# Patient Record
Sex: Female | Born: 1995 | Race: White | Marital: Married
Health system: Southern US, Community
[De-identification: ages and names within clinical notes are randomized; demographics above are authoritative.]

## PROBLEM LIST (undated history)

## (undated) ENCOUNTER — Inpatient Hospital Stay (HOSPITAL_COMMUNITY): Payer: Self-pay

## (undated) DIAGNOSIS — N329 Bladder disorder, unspecified: Secondary | ICD-10-CM

## (undated) DIAGNOSIS — N12 Tubulo-interstitial nephritis, not specified as acute or chronic: Secondary | ICD-10-CM

## (undated) DIAGNOSIS — R519 Headache, unspecified: Secondary | ICD-10-CM

## (undated) DIAGNOSIS — Z87448 Personal history of other diseases of urinary system: Secondary | ICD-10-CM

## (undated) DIAGNOSIS — Z3400 Encounter for supervision of normal first pregnancy, unspecified trimester: Secondary | ICD-10-CM

## (undated) DIAGNOSIS — Z3483 Encounter for supervision of other normal pregnancy, third trimester: Secondary | ICD-10-CM

## (undated) DIAGNOSIS — R51 Headache: Secondary | ICD-10-CM

## (undated) HISTORY — DX: Headache, unspecified: R51.9

## (undated) HISTORY — DX: Personal history of other diseases of urinary system: Z87.448

## (undated) HISTORY — DX: Headache: R51

## (undated) HISTORY — PX: WISDOM TOOTH EXTRACTION: SHX21

## (undated) HISTORY — PX: PERIPHERALLY INSERTED CENTRAL CATHETER INSERTION: SHX2221

---
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
## 2015-01-17 ENCOUNTER — Encounter (HOSPITAL_COMMUNITY): Payer: Self-pay | Admitting: *Deleted

## 2015-01-17 ENCOUNTER — Emergency Department (INDEPENDENT_AMBULATORY_CARE_PROVIDER_SITE_OTHER)
Admission: EM | Admit: 2015-01-17 | Discharge: 2015-01-17 | Disposition: A | Discharge status: Home / Self Care | Payer: Self-pay | Source: Home / Self Care | Attending: Family Medicine | Admitting: Family Medicine

## 2015-01-17 MED ORDER — IBUPROFEN 800 MG PO TABS: 800.0000 mg | ORAL_TABLET | Freq: Three times a day (TID) | ORAL | Status: DC

## 2015-01-17 MED ORDER — CYCLOBENZAPRINE HCL 5 MG PO TABS: 5.0000 mg | ORAL_TABLET | Freq: Three times a day (TID) | ORAL | Status: DC | PRN
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
## 2015-01-29 ENCOUNTER — Encounter (HOSPITAL_COMMUNITY): Payer: Self-pay | Admitting: Student

## 2015-01-29 ENCOUNTER — Inpatient Hospital Stay (HOSPITAL_COMMUNITY)
Admission: AD | Admit: 2015-01-29 | Discharge: 2015-01-29 | Disposition: A | Discharge status: Home / Self Care | Payer: Medicaid Other | Source: Ambulatory Visit | Attending: Obstetrics and Gynecology | Admitting: Obstetrics and Gynecology

## 2015-01-29 ENCOUNTER — Inpatient Hospital Stay (HOSPITAL_COMMUNITY): Payer: Medicaid Other

## 2015-01-29 ENCOUNTER — Encounter (HOSPITAL_COMMUNITY): Payer: Self-pay | Admitting: Emergency Medicine

## 2015-01-29 ENCOUNTER — Emergency Department (HOSPITAL_COMMUNITY)
Admission: EM | Admit: 2015-01-29 | Discharge: 2015-01-29 | Disposition: A | Discharge status: Home / Self Care | Payer: Medicaid Other | Source: Home / Self Care | Attending: Emergency Medicine | Admitting: Emergency Medicine

## 2015-01-29 ENCOUNTER — Other Ambulatory Visit (HOSPITAL_COMMUNITY)
Admission: RE | Admit: 2015-01-29 | Discharge: 2015-01-29 | Disposition: A | Discharge status: Home / Self Care | Payer: Medicaid Other | Source: Ambulatory Visit | Attending: Emergency Medicine | Admitting: Emergency Medicine

## 2015-01-29 DIAGNOSIS — R1032 Left lower quadrant pain: Secondary | ICD-10-CM | POA: Diagnosis not present

## 2015-01-29 DIAGNOSIS — O26899 Other specified pregnancy related conditions, unspecified trimester: Secondary | ICD-10-CM

## 2015-01-29 DIAGNOSIS — N760 Acute vaginitis: Secondary | ICD-10-CM | POA: Diagnosis not present

## 2015-01-29 DIAGNOSIS — Z3200 Encounter for pregnancy test, result unknown: Secondary | ICD-10-CM

## 2015-01-29 DIAGNOSIS — O3680X0 Pregnancy with inconclusive fetal viability, not applicable or unspecified: Secondary | ICD-10-CM

## 2015-01-29 DIAGNOSIS — Z113 Encounter for screening for infections with a predominantly sexual mode of transmission: Secondary | ICD-10-CM

## 2015-01-29 DIAGNOSIS — O2691 Pregnancy related conditions, unspecified, first trimester: Secondary | ICD-10-CM | POA: Diagnosis not present

## 2015-01-29 DIAGNOSIS — R109 Unspecified abdominal pain: Secondary | ICD-10-CM

## 2015-01-29 LAB — POCT URINALYSIS DIP (DEVICE)
BILIRUBIN URINE: NEGATIVE
Glucose, UA: NEGATIVE mg/dL
Hgb urine dipstick: NEGATIVE
KETONES UR: NEGATIVE mg/dL
Leukocytes, UA: NEGATIVE
Nitrite: NEGATIVE
PROTEIN: NEGATIVE mg/dL
Specific Gravity, Urine: 1.025 (ref 1.005–1.030)
Urobilinogen, UA: 0.2 mg/dL (ref 0.0–1.0)
pH: 5.5 (ref 5.0–8.0)

## 2015-01-29 LAB — WET PREP, GENITAL
Clue Cells Wet Prep HPF POC: NONE SEEN
Trich, Wet Prep: NONE SEEN
Yeast Wet Prep HPF POC: NONE SEEN

## 2015-01-29 LAB — CBC
HCT: 38.5 % (ref 36.0–46.0)
Hemoglobin: 13.4 g/dL (ref 12.0–15.0)
MCH: 29.8 pg (ref 26.0–34.0)
MCHC: 34.8 g/dL (ref 30.0–36.0)
MCV: 85.7 fL (ref 78.0–100.0)
PLATELETS: 317 10*3/uL (ref 150–400)
RBC: 4.49 MIL/uL (ref 3.87–5.11)
RDW: 13 % (ref 11.5–15.5)
WBC: 11.5 10*3/uL — ABNORMAL HIGH (ref 4.0–10.5)

## 2015-01-29 LAB — POCT PREGNANCY, URINE: Preg Test, Ur: NEGATIVE

## 2015-01-29 LAB — HCG, QUANTITATIVE, PREGNANCY: hCG, Beta Chain, Quant, S: 58 m[IU]/mL — ABNORMAL HIGH (ref <5)

## 2015-01-29 LAB — ABO/RH: ABO/RH(D): A POS

## 2015-01-29 MED ORDER — ACETAMINOPHEN-CODEINE #3 300-30 MG PO TABS: 1.0000 | ORAL_TABLET | Freq: Four times a day (QID) | ORAL | Status: DC | PRN

## 2015-01-29 NOTE — Discharge Instructions (Signed)
Ectopic Pregnancy An ectopic pregnancy happens when a fertilized egg grows outside the uterus. A pregnancy cannot live outside of the uterus. This problem often happens in the fallopian tube. It is often caused by damage to the fallopian tube. If this problem is found early, you may be treated with medicine. If your tube tears or bursts open (ruptures), you will bleed inside. This is an emergency. You will need surgery. Get help right away.  SYMPTOMS You may have normal pregnancy symptoms at first. These include:  Missing your period.  Feeling sick to your stomach (nauseous).  Being tired.  Having tender breasts. Then, you may start to have symptoms that are not normal. These include:  Pain with sex (intercourse).  Bleeding from the vagina. This includes light bleeding (spotting).  Belly (abdomen) or lower belly cramping or pain. This may be felt on one side.  A fast heartbeat (pulse).  Passing out (fainting) after going poop (bowel movement). If your tube tears, you may have symptoms such as:  Really bad pain in the belly or lower belly. This happens suddenly.  Dizziness.  Passing out.  Shoulder pain. GET HELP RIGHT AWAY IF:  You have any of these symptoms. This is an emergency. MAKE SURE YOU:  Understand these instructions.  Will watch your condition.  Will get help right away if you are not doing well or get worse.   This information is not intended to replace advice given to you by your health care provider. Make sure you discuss any questions you have with your health care provider.   Document Released: 06/25/2008 Document Revised: 04/03/2013 Document Reviewed: 11/08/2012 Elsevier Interactive Patient Education 2016 [employer]    Threatened Miscarriage A threatened miscarriage is when you have vaginal bleeding during your first 20 weeks of pregnancy but the pregnancy has not ended. Your doctor will do tests to make sure you are still pregnant. The cause of  the bleeding may not be known. This condition does not mean your pregnancy will end. It does increase the risk of it ending (complete miscarriage). HOME CARE   Make sure you keep all your doctor visits for prenatal care.  Get plenty of rest.  Do not have sex or use tampons if you have vaginal bleeding.  Do not douche.  Do not smoke or use drugs.  Do not drink alcohol.  Avoid caffeine. GET HELP IF:  You have light bleeding from your vagina.  You have belly pain or cramping.  You have a fever. GET HELP RIGHT AWAY IF:   You have heavy bleeding from your vagina.  You have clots of blood coming from your vagina.  You have bad pain or cramps in your low back or belly.  You have fever, chills, and bad belly pain. MAKE SURE YOU:   Understand these instructions.  Will watch your condition.  Will get help right away if you are not doing well or get worse.   This information is not intended to replace advice given to you by your health care provider. Make sure you discuss any questions you have with your health care provider.   Document Released: 03/11/2008 Document Revised: 04/03/2013 Document Reviewed: 01/23/2013 Elsevier Interactive Patient Education [employer] Elsevier Inc.

## 2015-01-29 NOTE — MAU Note (Signed)
Pt seen @ Urgent Care for LLQ pain & cramping, also lower back pain, hot flashes.  Pos HPT x 4.  UPT @ Urgent Care was negative, sent to MAU for further eval.

## 2015-01-29 NOTE — Discharge Instructions (Signed)
Please go directly to [facility] for evaluation of possible ectopic pregnancy.

## 2015-01-29 NOTE — MAU Note (Signed)
Left lower abd. Cramping x2-3 days, lower back pain, hot flashes.  4 home pregnancy tests positive.  To urgent care and pregnancy test negative.

## 2015-01-29 NOTE — ED Provider Notes (Signed)
CSN: [id number]     Arrival date & time 01/29/15  1300 History   First MD Initiated Contact with Patient 01/29/15 1314     Chief Complaint  Patient presents with  . Abdominal Cramping   (Consider location/radiation/quality/duration/timing/severity/associated sxs/prior Treatment) HPI  Leslie is a 19 year old woman here for evaluation of pelvic cramping. She states for the last 2-3 days she has had left pelvic cramping. She states it is fairly constant. It has not been worsening or improving. It seems to be a Ferraris worse with walking, particularly stepping down. No associated vaginal discharge. No new sexual partners. She and her husband are actively trying to get pregnant at this time. Last period was September 18. She reports a Kreider bit of urinary frequency, but denies urgency or dysuria. She denies other abdominal pain. She reports a Duddy bit of nausea in the morning, but states this resolves with eating breakfast. No fevers or chills. No diarrhea or constipation. She also has some low back pain. She states this is a chronic issue for her, but it has been a Wildasin more frequent in the last few weeks.    Past Medical History  Diagnosis Date  . Pyelonephritis     required hospitalization  . Bladder disorder     "small bladder" [facility]  . Pyelonephritis   . Normal pregnancy, first 02/20/2013  . HA (headache)    Past Surgical History  Procedure Laterality Date  . Peripherally inserted central catheter insertion      for home meds. due to pyelo   Family History  Problem Relation Age of Onset  . Diabetes Maternal Grandfather   . Breast cancer Maternal Grandmother    Social History  Substance Use Topics  . Smoking status: Former Smoker    Types: Cigarettes  . Smokeless tobacco: Never Used  . Alcohol Use: No   OB History    Gravida Para Term Preterm AB TAB SAB Ectopic Multiple Living   1 1 1       1      Review of Systems As in history of present illness Allergies   Review of patient's allergies indicates no known allergies.  Home Medications   Prior to Admission medications   Medication Sig Start Date End Date Taking? Authorizing Provider  albuterol (PROVENTIL HFA;VENTOLIN HFA) 108 (90 BASE) MCG/ACT inhaler Inhale 2 puffs into the lungs every 4 (four) hours as needed for wheezing or shortness of breath. 04/15/14   Hayden Rasmussenavid Mabe, NP  cyclobenzaprine (FLEXERIL) 5 MG tablet Take 1 tablet (5 mg total) by mouth 3 (three) times daily as needed for muscle spasms. 01/17/15   Linna HoffJames D Kindl, MD  ibuprofen (ADVIL,MOTRIN) 800 MG tablet Take 1 tablet (800 mg total) by mouth 3 (three) times daily. 01/17/15   Linna HoffJames D Kindl, MD  magnesium oxide (MAG-OX) 400 MG tablet Take 400 mg by mouth 2 (two) times daily.    Historical Provider, MD  nortriptyline (PAMELOR) 25 MG capsule Take 1 capsule (25 mg total) by mouth at bedtime. 02/25/14   Ronal FearLynn E Lam, NP  oseltamivir (TAMIFLU) 75 MG capsule Take 1 capsule (75 mg total) by mouth every 12 (twelve) hours. 04/12/14   Reuben Likesavid C Keller, MD  PARAGARD INTRAUTERINE COPPER IUD IUD 1 each by Intrauterine route once.    Historical Provider, MD  rizatriptan (MAXALT-MLT) 10 MG disintegrating tablet Take 1 tablet (10 mg total) by mouth as needed for migraine. May repeat in 2 hours if needed 11/15/13   Yijun  Terrace Arabia, MD  traMADol (ULTRAM) 50 MG tablet Take 2 tablets (100 mg total) by mouth every 8 (eight) hours as needed. 04/12/14   Reuben Likes, MD  vitamin B-12 (CYANOCOBALAMIN) 100 MCG tablet Take 100 mcg by mouth 2 (two) times daily.     Historical Provider, MD   Meds Ordered and Administered this Visit  Medications - No data to display  BP 122/71 mmHg  Pulse 99  Temp(Src) 98.5 F (36.9 C) (Oral)  Resp 16  SpO2 98%  LMP 12/29/2014 No data found.   Physical Exam  Constitutional: She is oriented to person, place, and time. She appears well-developed and well-nourished. No distress.  Neck: Neck supple.  Cardiovascular: Normal rate, regular rhythm  and normal heart sounds.   No murmur heard. Pulmonary/Chest: Effort normal and breath sounds normal. No respiratory distress. She has no wheezes. She has no rales.  Abdominal: Soft. Bowel sounds are normal. She exhibits no distension and no mass. There is tenderness (minimal tenderness in left lower abdomen). There is no rebound and no guarding.  Genitourinary: Vagina normal. There is no rash on the right labia. There is no rash on the left labia. Uterus is enlarged. Cervix exhibits no motion tenderness, no discharge and no friability. Right adnexum displays no mass and no tenderness. Left adnexum displays tenderness (mild) and fullness. No foreign body around the vagina. No vaginal discharge found.  Neurological: She is alert and oriented to person, place, and time.    ED Course  Procedures (including critical care time)  Labs Review Labs Reviewed  POCT URINALYSIS DIP (DEVICE)  POCT PREGNANCY, URINE  CERVICOVAGINAL ANCILLARY ONLY    Imaging Review No results found.    MDM   1. LLQ abdominal pain   2. Possible pregnancy    After doing the physical exam, patient reports she has had 4 positive home pregnancy test this morning. Even though our test here is negative, discussed with patient that we need to rule out possible ectopic pregnancy. We will discharge her and she will go directly to MAU for additional evaluation.  I have called and spoken with the triage nurse at the MAU.    Charm Rings, MD 01/29/15 1344

## 2015-01-29 NOTE — ED Notes (Signed)
Abdominal cramping, back pain.  Onset 01/27/15.  Reports hot flashes.  Last bm yesterday and normal per patient.  Reports urinating more frequently.  Most cramping on left.  And c/o hot flashes

## 2015-01-29 NOTE — MAU Provider Note (Signed)
History     CSN: [id number]  Arrival date and time: 01/29/15 1413   First Provider Initiated Contact with Patient 01/29/15 1626         Chief Complaint  Patient presents with  . Abdominal Pain   HPI  Leslie Gill is a 19 y.o. G2P1001 at 5078w3d who presents for abdominal pain.  LLQ pain x 3 days. Rates as 7/10. Describes as sharp & constant. No treatment.  Denies vaginal bleeding or discharge.  Denies n/v/d/constipation.  Denies dysuria or fever.   Sent from urgent care for evaluation. Negative UPT at urgent care, but patient had multiple positive UPTs at home.     OB History    Gravida Para Term Preterm AB TAB SAB Ectopic Multiple Living   2 1 1       1       Past Medical History  Diagnosis Date  . Pyelonephritis     required hospitalization  . Bladder disorder     "small bladder" [facility]  . Pyelonephritis   . Normal pregnancy, first 02/20/2013  . HA (headache)     Past Surgical History  Procedure Laterality Date  . Peripherally inserted central catheter insertion      for home meds. due to pyelo    Family History  Problem Relation Age of Onset  . Diabetes Maternal Grandfather   . Breast cancer Maternal Grandmother     Social History  Substance Use Topics  . Smoking status: Never Smoker   . Smokeless tobacco: Never Used  . Alcohol Use: No    Allergies: No Known Allergies  Prescriptions prior to admission  Medication Sig Dispense Refill Last Dose  . Acetaminophen-Caff-Pyrilamine (MIDOL COMPLETE PO) Take 2 capsules by mouth daily as needed (cramping).   Past Week at Unknown time  . ibuprofen (ADVIL,MOTRIN) 200 MG tablet Take 400-600 mg by mouth every 6 (six) hours as needed for headache (migraines).   Past Week at Unknown time  . albuterol (PROVENTIL HFA;VENTOLIN HFA) 108 (90 BASE) MCG/ACT inhaler Inhale 2 puffs into the lungs every 4 (four) hours as needed for wheezing or shortness of breath. (Patient not taking: Reported on 01/29/2015) 1  Inhaler 0 Not Taking at Unknown time  . cyclobenzaprine (FLEXERIL) 5 MG tablet Take 1 tablet (5 mg total) by mouth 3 (three) times daily as needed for muscle spasms. (Patient not taking: Reported on 01/29/2015) 30 tablet 0 Not Taking at Unknown time  . ibuprofen (ADVIL,MOTRIN) 800 MG tablet Take 1 tablet (800 mg total) by mouth 3 (three) times daily. (Patient not taking: Reported on 01/29/2015) 30 tablet 0 Not Taking at Unknown time  . nortriptyline (PAMELOR) 25 MG capsule Take 1 capsule (25 mg total) by mouth at bedtime. (Patient not taking: Reported on 01/29/2015) 30 capsule 5 Not Taking at Unknown time  . oseltamivir (TAMIFLU) 75 MG capsule Take 1 capsule (75 mg total) by mouth every 12 (twelve) hours. (Patient not taking: Reported on 01/29/2015) 10 capsule 0 Completed Course at Unknown time  . rizatriptan (MAXALT-MLT) 10 MG disintegrating tablet Take 1 tablet (10 mg total) by mouth as needed for migraine. May repeat in 2 hours if needed (Patient not taking: Reported on 01/29/2015) 15 tablet 11 Not Taking at Unknown time  . traMADol (ULTRAM) 50 MG tablet Take 2 tablets (100 mg total) by mouth every 8 (eight) hours as needed. (Patient not taking: Reported on 01/29/2015) 30 tablet 0 Not Taking at Unknown time    Review of  Systems  Constitutional: Negative.   Respiratory: Negative.   Cardiovascular: Negative.   Gastrointestinal: Positive for abdominal pain. Negative for nausea, vomiting, diarrhea and constipation.  Genitourinary: Negative.   Neurological: Negative for dizziness.   Physical Exam   Blood pressure 124/74, pulse 94, temperature 98.4 F (36.9 C), temperature source Oral, resp. rate 18, height  (1.6 m), weight 194 lb (87.998 kg), last menstrual period 12/29/2014, not currently breastfeeding.  Physical Exam  Nursing note and vitals reviewed. Constitutional: She is oriented to person, place, and time. She appears well-developed and well-nourished. No distress.  HENT:  Head:  Normocephalic and atraumatic.  Eyes: Conjunctivae are normal. Right eye exhibits no discharge. Left eye exhibits no discharge. No scleral icterus.  Neck: Normal range of motion.  Cardiovascular: Normal rate, regular rhythm and normal heart sounds.   No murmur heard. Respiratory: Effort normal and breath sounds normal. No respiratory distress. She has no wheezes.  GI: Soft. Bowel sounds are normal. She exhibits no distension. There is tenderness (mild tenderness in LLQ with deep palpation).  Genitourinary:  Pelvic exam deferred; exam performed & documented by MD at urgent care this afternoon.   Neurological: She is alert and oriented to person, place, and time.  Skin: Skin is warm and dry. She is not diaphoretic.  Psychiatric: She has a normal mood and affect. Her behavior is normal. Judgment and thought content normal.    MAU Course  Procedures Results for orders placed or performed during the hospital encounter of 01/29/15 (from the past 24 hour(s))  hCG, quantitative, pregnancy     Status: Abnormal   Collection Time: 01/29/15  2:32 PM  Result Value Ref Range   hCG, Beta Chain, Quant, S 58 (H) <5 mIU/mL  CBC     Status: Abnormal   Collection Time: 01/29/15  2:37 PM  Result Value Ref Range   WBC 11.5 (H) 4.0 - 10.5 K/uL   RBC 4.49 3.87 - 5.11 MIL/uL   Hemoglobin 13.4 12.0 - 15.0 g/dL   HCT 16.1 09.6 - 04.5 %   MCV 85.7 78.0 - 100.0 fL   MCH 29.8 26.0 - 34.0 pg   MCHC 34.8 30.0 - 36.0 g/dL   RDW 40.9 81.1 - 91.4 %   Platelets 317 150 - 400 K/uL  ABO/Rh     Status: None   Collection Time: 01/29/15  2:37 PM  Result Value Ref Range   ABO/RH(D) A POS   Wet prep, genital     Status: Abnormal   Collection Time: 01/29/15  4:40 PM  Result Value Ref Range   Yeast Wet Prep HPF POC NONE SEEN NONE SEEN   Trich, Wet Prep NONE SEEN NONE SEEN   Clue Cells Wet Prep HPF POC NONE SEEN NONE SEEN   WBC, Wet Prep HPF POC RARE (A) NONE SEEN   US Ob Comp Less 14 Wks  01/29/2015  CLINICAL DATA:   Left lower quadrant pain, abdominal cramping, back pain. EXAM: OBSTETRIC <14 WK [place] AND TRANSVAGINAL OB US TECHNIQUE: Both transabdominal and transvaginal ultrasound examinations were performed for complete evaluation of the gestation as well as the maternal uterus, adnexal regions, and pelvic cul-de-sac. Transvaginal technique was performed to assess early pregnancy. COMPARISON:  None. FINDINGS: Intrauterine gestational sac: No definite gestational sac visualized. Yolk sac:  None. Embryo:  None. Cardiac Activity: None. Maternal uterus/adnexae: Endometrium is thickened, measuring 1.7 cm. A possible corpus luteum cyst is seen in the left ovary, measuring 1.5 cm. Ovaries are otherwise unremarkable. Small  free fluid. IMPRESSION: 1. No visible intrauterine gestational sac. This could be due to early IUP. Ectopic or failed pregnancy cannot be excluded. 2. Small free fluid. Electronically Signed   By: Leanna Battles M.D.   On: 01/29/2015 17:41   US Ob Transvaginal  01/29/2015  CLINICAL DATA:  Left lower quadrant pain, abdominal cramping, back pain. EXAM: OBSTETRIC <14 WK [place] AND TRANSVAGINAL OB US TECHNIQUE: Both transabdominal and transvaginal ultrasound examinations were performed for complete evaluation of the gestation as well as the maternal uterus, adnexal regions, and pelvic cul-de-sac. Transvaginal technique was performed to assess early pregnancy. COMPARISON:  None. FINDINGS: Intrauterine gestational sac: No definite gestational sac visualized. Yolk sac:  None. Embryo:  None. Cardiac Activity: None. Maternal uterus/adnexae: Endometrium is thickened, measuring 1.7 cm. A possible corpus luteum cyst is seen in the left ovary, measuring 1.5 cm. Ovaries are otherwise unremarkable. Small free fluid. IMPRESSION: 1. No visible intrauterine gestational sac. This could be due to early IUP. Ectopic or failed pregnancy cannot be excluded. 2. Small free fluid. Electronically Signed   By: Leanna Battles M.D.   On:  01/29/2015 17:41    MDM 1748- D/w Dr. Mindi Slicker. Discussed pt presentation, labs, & ultrasound. Pt to f/u in MAU in 48 hrs for BHCG  Assessment and Plan  A: 1. Pregnancy of unknown anatomic location   2. Abdominal pain in pregnancy    P: Discharge home Rx tylenol 3 Return to MAU in 48 hrs for BHCG Ectopic & SAB precautions discussed.   Judeth Horn, NP  01/29/2015, 4:25 PM

## 2015-01-30 LAB — GC/CHLAMYDIA PROBE AMP (~~LOC~~) NOT AT ARMC
Chlamydia: NEGATIVE
Neisseria Gonorrhea: NEGATIVE

## 2015-01-30 NOTE — Progress Notes (Signed)
Call received from cytology regarding duplicate testing (GC/Chlamydia and wet prep) reviewed and transferred to APP

## 2015-01-31 ENCOUNTER — Inpatient Hospital Stay (HOSPITAL_COMMUNITY)
Admission: AD | Admit: 2015-01-31 | Discharge: 2015-01-31 | Disposition: A | Discharge status: Home / Self Care | Payer: Medicaid Other | Source: Ambulatory Visit | Attending: Obstetrics and Gynecology | Admitting: Obstetrics and Gynecology

## 2015-01-31 DIAGNOSIS — Z3A01 Less than 8 weeks gestation of pregnancy: Secondary | ICD-10-CM

## 2015-01-31 DIAGNOSIS — O9989 Other specified diseases and conditions complicating pregnancy, childbirth and the puerperium: Secondary | ICD-10-CM

## 2015-01-31 DIAGNOSIS — O26891 Other specified pregnancy related conditions, first trimester: Secondary | ICD-10-CM | POA: Insufficient documentation

## 2015-01-31 DIAGNOSIS — O26899 Other specified pregnancy related conditions, unspecified trimester: Secondary | ICD-10-CM

## 2015-01-31 DIAGNOSIS — R102 Pelvic and perineal pain: Secondary | ICD-10-CM

## 2015-01-31 DIAGNOSIS — R1032 Left lower quadrant pain: Secondary | ICD-10-CM | POA: Diagnosis present

## 2015-01-31 DIAGNOSIS — Z3490 Encounter for supervision of normal pregnancy, unspecified, unspecified trimester: Secondary | ICD-10-CM

## 2015-01-31 LAB — CERVICOVAGINAL ANCILLARY ONLY
CHLAMYDIA, DNA PROBE: NEGATIVE
Neisseria Gonorrhea: NEGATIVE
Trichomonas: NEGATIVE
Wet Prep (BD Affirm): NEGATIVE

## 2015-01-31 LAB — HCG, QUANTITATIVE, PREGNANCY: hCG, Beta Chain, Quant, S: 137 m[IU]/mL — ABNORMAL HIGH (ref <5)

## 2015-01-31 NOTE — ED Notes (Signed)
Wet prep negative, vaginal swabs negative for STD

## 2015-01-31 NOTE — ED Notes (Signed)
Wet prep negative; waiting for STD report

## 2015-01-31 NOTE — MAU Provider Note (Signed)
  History    CSN: [id number] Arrival date and time: 01/31/15 1127  Chief Complaint  Patient presents with  . Follow-up   HPI Leslie Gill is 19 y.o. G2P1001 at 5570w5d presenting after being seen on 10/19 with LLQ pain. Since being seen the pain has improved. She only reports mild back pain, located in mid back and is non-radiating. She denies N/V, fever, chills, dysuria.  OB History    Gravida Para Term Preterm AB TAB SAB Ectopic Multiple Living   2 1 1       1       Past Medical History  Diagnosis Date  . Pyelonephritis     required hospitalization  . Bladder disorder     "small bladder" [facility]  . Pyelonephritis   . Normal pregnancy, first 02/20/2013  . HA (headache)     Past Surgical History  Procedure Laterality Date  . Peripherally inserted central catheter insertion      for home meds. due to pyelo    Family History  Problem Relation Age of Onset  . Diabetes Maternal Grandfather   . Breast cancer Maternal Grandmother     Social History  Substance Use Topics  . Smoking status: Never Smoker   . Smokeless tobacco: Never Used  . Alcohol Use: No    Allergies: No Known Allergies  Prescriptions prior to admission  Medication Sig Dispense Refill Last Dose  . acetaminophen-codeine (TYLENOL #3) 300-30 MG tablet Take 1-2 tablets by mouth every 6 (six) hours as needed for moderate pain. 15 tablet 0     Review of Systems  Constitutional: Negative for fever and chills.  Eyes: Negative for blurred vision and double vision.  Respiratory: Negative for cough and shortness of breath.   Cardiovascular: Negative for chest pain and orthopnea.  Gastrointestinal: Negative for nausea and vomiting.  Genitourinary: Negative for dysuria, frequency and flank pain.  Musculoskeletal: Negative for myalgias.  Skin: Negative for rash.  Neurological: Negative for dizziness, tingling, weakness and headaches.  Endo/Heme/Allergies: Does not bruise/bleed easily.   Psychiatric/Behavioral: Negative for depression and suicidal ideas. The patient is not nervous/anxious.    Physical Exam   Blood pressure 127/68, pulse 97, temperature 98.3 F (36.8 C), temperature source Oral, resp. rate 18, height 5\' 3"  (1.6 m), weight 194 lb 12.8 oz (88.361 kg), last menstrual period 12/29/2014, not currently breastfeeding.  Physical Exam  Nursing note and vitals reviewed. Constitutional: She is oriented to person, place, and time. She appears well-developed and well-nourished.  No acute distress  HENT:  Head: Normocephalic and atraumatic.  Eyes: No scleral icterus.  Neck: Neck supple.  Respiratory: Effort normal.  GI: Soft. There is no tenderness.  Neurological: She is alert and oriented to person, place, and time.  Skin: Skin is warm and dry. No rash noted.  Psychiatric: She has a normal mood and affect.    MAU Course  Procedures  MDM  Lab Results  Component Value Date   HCGBETAQNT 137* 01/31/2015   HCGBETAQNT 58* 01/29/2015   Assessment and Plan  Differential includes normal variant, threatened AB, ectopic pregnancy. Patient has not had US and bHCG is below discriminatory zone but has doubled in 48 hours.  Pelvic pain from initial presentation has resolved.  Repeat bHCG in 48 hours, placed order in signed/held Likely will need US in 7-10 days pending repeat bHCG Discharge to home Reviewed SAB and ectopic precuations  Isa RankinKimberly Niles Demarco Bacci 01/31/2015, 1:01 PM

## 2015-01-31 NOTE — MAU Note (Signed)
Pt here for f/u BHCG. Denies Pain or cramping or bleeding

## 2015-02-02 ENCOUNTER — Inpatient Hospital Stay (HOSPITAL_COMMUNITY)
Admission: AD | Admit: 2015-02-02 | Discharge: 2015-02-02 | Disposition: A | Discharge status: Home / Self Care | Payer: Medicaid Other | Source: Ambulatory Visit | Attending: Obstetrics and Gynecology | Admitting: Obstetrics and Gynecology

## 2015-02-02 DIAGNOSIS — Z3A01 Less than 8 weeks gestation of pregnancy: Secondary | ICD-10-CM | POA: Diagnosis not present

## 2015-02-02 DIAGNOSIS — Z3201 Encounter for pregnancy test, result positive: Secondary | ICD-10-CM | POA: Diagnosis not present

## 2015-02-02 DIAGNOSIS — O26891 Other specified pregnancy related conditions, first trimester: Secondary | ICD-10-CM | POA: Diagnosis not present

## 2015-02-02 DIAGNOSIS — R102 Pelvic and perineal pain: Secondary | ICD-10-CM | POA: Insufficient documentation

## 2015-02-02 LAB — HCG, QUANTITATIVE, PREGNANCY: HCG, BETA CHAIN, QUANT, S: 278 m[IU]/mL — AB (ref <5)

## 2015-02-02 NOTE — MAU Note (Signed)
Folden crampy, but nothing bad.  Denies any bleeding.

## 2015-02-02 NOTE — Discharge Instructions (Signed)

## 2015-02-02 NOTE — MAU Provider Note (Signed)
S: 19 y.o. G2P1001 @[redacted]w[redacted]d  by LMP presents to MAU for repeat hcg.  She presented initially on 10/19 with abdominal/pelvic pain with positive pregnancy test at home.  She denies abdominal pain or vaginal bleeding today.  Pain resolved spontaneously.  Her quant hcg on 10/19 was 58 and ultrasound showed no IUP, no ectopic.  On 10/21, quant hcg rose appropriately to 137.  Due to low numbers, pt brought back to MAU in 48 hours to evaluate for continued appropriate rise.    O: BP 134/80 mmHg  Pulse 86  Temp(Src) 98.9 F (37.2 C) (Oral)  Resp 18  LMP 12/29/2014  VS reviewed, nursing note reviewed,  Constitutional: well developed, well nourished, no distress HEENT: normocephalic CV: normal rate Pulm/chest wall: normal effort Abdomen: soft Neuro: alert and oriented x 3 Skin: warm, dry Psych: affect normal  Results for orders placed or performed during the hospital encounter of 02/02/15 (from the past 24 hour(s))  hCG, quantitative, pregnancy     Status: Abnormal   Collection Time: 02/02/15  8:15 AM  Result Value Ref Range   hCG, Beta Chain, Quant, S 278 (H) <5 mIU/mL    --/--/A POS (10/19 1437)  A: 1. Pelvic pain affecting pregnancy in first trimester, antepartum   2. Positive blood pregnancy test     MDM: Ordered labs and reviewed results.  With appropriate rise in hcg x 2, will plan outpatient US in 1 week with follow up in MAU same day for results.  If US normal, pt may begin prenatal care after full ectopic evaluation completed in MAU.  Pt stable at time of discharge.   P: D/C home with ectopic/bleeding precautions F/U with outpatient ultrasound as ordered Return to MAU as needed for emergencies  LEFTWICH-KIRBY, LISA, CNM 2:18 PM

## 2015-02-12 ENCOUNTER — Ambulatory Visit (HOSPITAL_COMMUNITY)
Admission: RE | Admit: 2015-02-12 | Discharge: 2015-02-12 | Disposition: A | Discharge status: Home / Self Care | Payer: Medicaid Other | Source: Ambulatory Visit | Attending: Advanced Practice Midwife | Admitting: Advanced Practice Midwife

## 2015-02-12 ENCOUNTER — Inpatient Hospital Stay (HOSPITAL_COMMUNITY)
Admission: AD | Admit: 2015-02-12 | Discharge: 2015-02-12 | Disposition: A | Discharge status: Home / Self Care | Payer: Medicaid Other | Source: Ambulatory Visit | Attending: Obstetrics and Gynecology | Admitting: Obstetrics and Gynecology

## 2015-02-12 DIAGNOSIS — Z3A01 Less than 8 weeks gestation of pregnancy: Secondary | ICD-10-CM | POA: Insufficient documentation

## 2015-02-12 DIAGNOSIS — O26891 Other specified pregnancy related conditions, first trimester: Secondary | ICD-10-CM | POA: Diagnosis not present

## 2015-02-12 DIAGNOSIS — O208 Other hemorrhage in early pregnancy: Secondary | ICD-10-CM | POA: Diagnosis not present

## 2015-02-12 DIAGNOSIS — R102 Pelvic and perineal pain: Secondary | ICD-10-CM | POA: Diagnosis not present

## 2015-02-12 DIAGNOSIS — O418X10 Other specified disorders of amniotic fluid and membranes, first trimester, not applicable or unspecified: Secondary | ICD-10-CM

## 2015-02-12 DIAGNOSIS — O468X1 Other antepartum hemorrhage, first trimester: Secondary | ICD-10-CM

## 2015-02-12 DIAGNOSIS — Z3491 Encounter for supervision of normal pregnancy, unspecified, first trimester: Secondary | ICD-10-CM

## 2015-02-12 NOTE — Discharge Instructions (Signed)
First Trimester of Pregnancy The first trimester of pregnancy is from week 1 until the end of week 12 (months 1 through 3). A week after a sperm fertilizes an egg, the egg will implant on the wall of the uterus. This embryo will begin to develop into a baby. Genes from you and your partner are forming the baby. The female genes determine whether the baby is a boy or a girl. At 6-8 weeks, the eyes and face are formed, and the heartbeat can be seen on ultrasound. At the end of 12 weeks, all the baby's organs are formed.  Now that you are pregnant, you will want to do everything you can to have a healthy baby. Two of the most important things are to get good prenatal care and to follow your health care provider's instructions. Prenatal care is all the medical care you receive before the baby's birth. This care will help prevent, find, and treat any problems during the pregnancy and childbirth. BODY CHANGES Your body goes through many changes during pregnancy. The changes vary from woman to woman.   You may gain or lose a couple of pounds at first.  You may feel sick to your stomach (nauseous) and throw up (vomit). If the vomiting is uncontrollable, call your health care provider.  You may tire easily.  You may develop headaches that can be relieved by medicines approved by your health care provider.  You may urinate more often. Painful urination may mean you have a bladder infection.  You may develop heartburn as a result of your pregnancy.  You may develop constipation because certain hormones are causing the muscles that push waste through your intestines to slow down.  You may develop hemorrhoids or swollen, bulging veins (varicose veins).  Your breasts may begin to grow larger and become tender. Your nipples may stick out more, and the tissue that surrounds them (areola) may become darker.  Your gums may bleed and may be sensitive to brushing and flossing.  Dark spots or blotches (chloasma,  mask of pregnancy) may develop on your face. This will likely fade after the baby is born.  Your menstrual periods will stop.  You may have a loss of appetite.  You may develop cravings for certain kinds of food.  You may have changes in your emotions from day to day, such as being excited to be pregnant or being concerned that something may go wrong with the pregnancy and baby.  You may have more vivid and strange dreams.  You may have changes in your hair. These can include thickening of your hair, rapid growth, and changes in texture. Some women also have hair loss during or after pregnancy, or hair that feels dry or thin. Your hair will most likely return to normal after your baby is born. WHAT TO EXPECT AT YOUR PRENATAL VISITS During a routine prenatal visit:  You will be weighed to make sure you and the baby are growing normally.  Your blood pressure will be taken.  Your abdomen will be measured to track your baby's growth.  The fetal heartbeat will be listened to starting around week 10 or 12 of your pregnancy.  Test results from any previous visits will be discussed. Your health care provider may ask you:  How you are feeling.  If you are feeling the baby move.  If you have had any abnormal symptoms, such as leaking fluid, bleeding, severe headaches, or abdominal cramping.  If you are using any tobacco products,   including cigarettes, chewing tobacco, and electronic cigarettes.  If you have any questions. Other tests that may be performed during your first trimester include:  Blood tests to find your blood type and to check for the presence of any previous infections. They will also be used to check for low iron levels (anemia) and Rh antibodies. Later in the pregnancy, blood tests for diabetes will be done along with other tests if problems develop.  Urine tests to check for infections, diabetes, or protein in the urine.  An ultrasound to confirm the proper growth  and development of the baby.  An amniocentesis to check for possible genetic problems.  Fetal screens for spina bifida and Down syndrome.  You may need other tests to make sure you and the baby are doing well.  HIV (human immunodeficiency virus) testing. Routine prenatal testing includes screening for HIV, unless you choose not to have this test. HOME CARE INSTRUCTIONS  Medicines  Follow your health care provider's instructions regarding medicine use. Specific medicines may be either safe or unsafe to take during pregnancy.  Take your prenatal vitamins as directed.  If you develop constipation, try taking a stool softener if your health care provider approves. Diet  Eat regular, well-balanced meals. Choose a variety of foods, such as meat or vegetable-based protein, fish, milk and low-fat dairy products, vegetables, fruits, and whole grain breads and cereals. Your health care provider will help you determine the amount of weight gain that is right for you.  Avoid raw meat and uncooked cheese. These carry germs that can cause birth defects in the baby.  Eating four or five small meals rather than three large meals a day may help relieve nausea and vomiting. If you start to feel nauseous, eating a few soda crackers can be helpful. Drinking liquids between meals instead of during meals also seems to help nausea and vomiting.  If you develop constipation, eat more high-fiber foods, such as fresh vegetables or fruit and whole grains. Drink enough fluids to keep your urine clear or pale yellow. Activity and Exercise  Exercise only as directed by your health care provider. Exercising will help you:  Control your weight.  Stay in shape.  Be prepared for labor and delivery.  Experiencing pain or cramping in the lower abdomen or low back is a good sign that you should stop exercising. Check with your health care provider before continuing normal exercises.  Try to avoid standing for long  periods of time. Move your legs often if you must stand in one place for a long time.  Avoid heavy lifting.  Wear low-heeled shoes, and practice good posture.  You may continue to have sex unless your health care provider directs you otherwise. Relief of Pain or Discomfort  Wear a good support bra for breast tenderness.   Take warm sitz baths to soothe any pain or discomfort caused by hemorrhoids. Use hemorrhoid cream if your health care provider approves.   Rest with your legs elevated if you have leg cramps or low back pain.  If you develop varicose veins in your legs, wear support hose. Elevate your feet for 15 minutes, 3-4 times a day. Limit salt in your diet. Prenatal Care  Schedule your prenatal visits by the twelfth week of pregnancy. They are usually scheduled monthly at first, then more often in the last 2 months before delivery.  Write down your questions. Take them to your prenatal visits.  Keep all your prenatal visits as directed by your   health care provider. Safety  Wear your seat belt at all times when driving.  Make a list of emergency phone numbers, including numbers for family, friends, the hospital, and police and fire departments. General Tips  Ask your health care provider for a referral to a local prenatal education class. Begin classes no later than at the beginning of month 6 of your pregnancy.  Ask for help if you have counseling or nutritional needs during pregnancy. Your health care provider can offer advice or refer you to specialists for help with various needs.  Do not use hot tubs, steam rooms, or saunas.  Do not douche or use tampons or scented sanitary pads.  Do not cross your legs for long periods of time.  Avoid cat litter boxes and soil used by cats. These carry germs that can cause birth defects in the baby and possibly loss of the fetus by miscarriage or stillbirth.  Avoid all smoking, herbs, alcohol, and medicines not prescribed by  your health care provider. Chemicals in these affect the formation and growth of the baby.  Do not use any tobacco products, including cigarettes, chewing tobacco, and electronic cigarettes. If you need help quitting, ask your health care provider. You may receive counseling support and other resources to help you quit.  Schedule a dentist appointment. At home, brush your teeth with a soft toothbrush and be gentle when you floss. SEEK MEDICAL CARE IF:   You have dizziness.  You have mild pelvic cramps, pelvic pressure, or nagging pain in the abdominal area.  You have persistent nausea, vomiting, or diarrhea.  You have a bad smelling vaginal discharge.  You have pain with urination.  You notice increased swelling in your face, hands, legs, or ankles. SEEK IMMEDIATE MEDICAL CARE IF:   You have a fever.  You are leaking fluid from your vagina.  You have spotting or bleeding from your vagina.  You have severe abdominal cramping or pain.  You have rapid weight gain or loss.  You vomit blood or material that looks like coffee grounds.  You are exposed to German measles and have never had them.  You are exposed to fifth disease or chickenpox.  You develop a severe headache.  You have shortness of breath.  You have any kind of trauma, such as from a fall or a car accident.   This information is not intended to replace advice given to you by your health care provider. Make sure you discuss any questions you have with your health care provider.   Document Released: 03/23/2001 Document Revised: 04/19/2014 Document Reviewed: 02/06/2013 Elsevier Interactive Patient Education 2016 [employer]  

## 2015-02-12 NOTE — MAU Provider Note (Signed)
History   [id number]   Chief Complaint  Patient presents with  . Follow-up    HPI Leslie Gill is a 19 y.o. female  G2P1001 here with for follow-up ultrasound.  Upon review of the records patient was first seen on 10/19 for abdominal pain.   BHCG on that day was 58.  Ultrasound showed no IUP.  GC/CT and wet prep were collected.  Results were negative.   Pt discharged home.   Pt here today with no report of abdominal pain or vaginal bleeding.   All other systems wnl.  Last seen in MAU on 10/23.      BHCG was 278.   First prenatal appt with [facility] ob[facility] is scheduled for tomorrow.   Patient's last menstrual period was 12/29/2014.  OB History  Gravida Para Term Preterm AB SAB TAB Ectopic Multiple Living  # Outcome Date GA Lbr Len/2nd Weight Sex Delivery Anes PTL Lv  2 Current           1 Term 02/21/13 [redacted]w[redacted]d 09:13 / 01:49 7 lb 11.3 oz (3.496 kg) F Vag-Spont EPI  Y      Past Medical History  Diagnosis Date  . Pyelonephritis     required hospitalization  . Bladder disorder     "small bladder" Sees MD at [facility]  . Pyelonephritis   . Normal pregnancy, first 02/20/2013  . HA (headache)     Family History  Problem Relation Age of Onset  . Diabetes Maternal Grandfather   . Breast cancer Maternal Grandmother     Social History   Social History  . Marital Status: Single    Spouse Name: N/A  . Number of Children: 1  . Years of Education: 70   Social History Main Topics  . Smoking status: Never Smoker   . Smokeless tobacco: Never Used  . Alcohol Use: No  . Drug Use: No  . Sexual Activity: Yes   Other Topics Concern  . Not on file   Social History Narrative   Patient Is single and lives at home with her boy friend and daughter   Patient is unemployed.   Education high school.   Right handed.   Caffeine Intake: 1 serving everyday.    No Known Allergies  No current facility-administered medications on file prior to encounter.   Current  Outpatient Prescriptions on File Prior to Encounter  Medication Sig Dispense Refill  . acetaminophen-codeine (TYLENOL #3) 300-30 MG tablet Take 1-2 tablets by mouth every 6 (six) hours as needed for moderate pain. 15 tablet 0     Physical Exam   There were no vitals filed for this visit.  Physical Exam  Constitutional: She appears well-developed and well-nourished. No distress.  HENT:  Head: Normocephalic and atraumatic.  Respiratory: Effort normal. No respiratory distress.  Skin: She is not diaphoretic.  Psychiatric: She has a normal mood and affect. Her behavior is normal. Judgment and thought content normal.    MAU Course  Procedures US Ob Transvaginal  02/12/2015  CLINICAL DATA:  Pelvic pain affecting first-trimester pregnancy. EXAM: OBSTETRIC <14 WK [place] AND TRANSVAGINAL OB US TECHNIQUE: Both transabdominal and transvaginal ultrasound examinations were performed for complete evaluation of the gestation as well as the maternal uterus, adnexal regions, and pelvic cul-de-sac. Transvaginal technique was performed to assess early pregnancy. COMPARISON:  None. FINDINGS: Intrauterine gestational sac: Single Yolk sac:  Yes Embryo:  No Cardiac Activity: No Heart  Rate: Not applicable  bpm MSD: 10.5  mm   5 w   6  d Maternal uterus/adnexae: Subchorionic hemorrhage: Small subchorionic hemorrhage noted. This measures 1.9 x 0.4 x 2.2 cm Right ovary: Normal Left ovary: Normal Other :None Free fluid:  Trace. IMPRESSION: 1. Single intrauterine gestational sac containing a yolk sac. Probable early intrauterine gestational sac containing yolk sac. No fetal pole, or cardiac activity yet visualized. Recommend follow-up quantitative B-HCG levels and follow-up US in 14 days to confirm and assess viability. This recommendation follows SRU consensus guidelines: Diagnostic Criteria for Nonviable Pregnancy Early in the First Trimester. Malva Limes Engl J Med 2013; 161:0960-45; 369:1443-51. 2. Small subchorionic hemorrhage. Electronically  Signed   By: Signa Kellaylor  Stroud M.D.   On: 02/12/2015 11:33    MDM Ultrasound shows IUGS with yolk sac & small subchorionic hemorrhage.  Pt denies abdominal pain or vaginal bleeding.  [facility]  Assessment and Plan  19 y.o. G2P1001 at 1248w3d IUP  1. Pelvic pain affecting pregnancy in first trimester, antepartum   2. Normal IUP (intrauterine pregnancy) on prenatal ultrasound, first trimester   3. Subchorionic hematoma in first trimester    P; Discharge home Keep appt for prenatal care Discussed reasons to return to MAU  Judeth HornErin Cantrell Larouche, NP 02/12/2015 11:40 AM     First prenatal appt tomorrow

## 2015-03-03 LAB — OB RESULTS CONSOLE ABO/RH: RH TYPE: POSITIVE

## 2015-03-03 LAB — OB RESULTS CONSOLE HEPATITIS B SURFACE ANTIGEN: Hepatitis B Surface Ag: NEGATIVE

## 2015-03-03 LAB — OB RESULTS CONSOLE GC/CHLAMYDIA
Chlamydia: NEGATIVE
GC PROBE AMP, GENITAL: NEGATIVE

## 2015-03-03 LAB — OB RESULTS CONSOLE RUBELLA ANTIBODY, IGM: Rubella: IMMUNE

## 2015-03-03 LAB — OB RESULTS CONSOLE ANTIBODY SCREEN: Antibody Screen: NEGATIVE

## 2015-03-03 LAB — OB RESULTS CONSOLE RPR: RPR: NONREACTIVE

## 2015-03-03 LAB — OB RESULTS CONSOLE HIV ANTIBODY (ROUTINE TESTING): HIV: NONREACTIVE

## 2015-04-13 NOTE — L&D Delivery Note (Signed)
Delivery Note Pt pushed very well with 5 contractions for delivery.  At 12:43 PM a viable and healthy female was delivered via Vaginal, Spontaneous Delivery (Presentation: OA; ROA ).  APGAR: 9, 9; weight P .   Placenta status: Intact, Spontaneous.  Cord: 3 vessels with the following complications: None.    Anesthesia: Epidural  Episiotomy: None Lacerations: L 1st degree Perineal; R Labial Suture Repair: 3.0 vicryl rapide Est. Blood Loss (mL): 200cc  Mom to postpartum.  Baby to Couplet care / Skin to Skin.  Bovard-Stuckert, Naira Standiford 10/09/2015, 1:10 PM  Parents decline circumcision for female infant  Br/A+/Contra Nexplanon/RI/ Tdap [facility]

## 2015-09-04 ENCOUNTER — Inpatient Hospital Stay (HOSPITAL_COMMUNITY)
Admission: AD | Admit: 2015-09-04 | Discharge: 2015-09-04 | Disposition: A | Discharge status: Home / Self Care | Payer: Medicaid Other | Source: Ambulatory Visit | Attending: Obstetrics and Gynecology | Admitting: Obstetrics and Gynecology

## 2015-09-04 ENCOUNTER — Encounter (HOSPITAL_COMMUNITY): Payer: Self-pay | Admitting: *Deleted

## 2015-09-04 DIAGNOSIS — N949 Unspecified condition associated with female genital organs and menstrual cycle: Secondary | ICD-10-CM | POA: Diagnosis not present

## 2015-09-04 DIAGNOSIS — O9989 Other specified diseases and conditions complicating pregnancy, childbirth and the puerperium: Secondary | ICD-10-CM

## 2015-09-04 DIAGNOSIS — R102 Pelvic and perineal pain: Secondary | ICD-10-CM | POA: Diagnosis not present

## 2015-09-04 DIAGNOSIS — Z3A34 34 weeks gestation of pregnancy: Secondary | ICD-10-CM | POA: Insufficient documentation

## 2015-09-04 DIAGNOSIS — O26893 Other specified pregnancy related conditions, third trimester: Secondary | ICD-10-CM | POA: Diagnosis not present

## 2015-09-04 DIAGNOSIS — R109 Unspecified abdominal pain: Secondary | ICD-10-CM | POA: Diagnosis present

## 2015-09-04 DIAGNOSIS — O26899 Other specified pregnancy related conditions, unspecified trimester: Secondary | ICD-10-CM

## 2015-09-04 LAB — URINE MICROSCOPIC-ADD ON

## 2015-09-04 LAB — URINALYSIS, ROUTINE W REFLEX MICROSCOPIC
Bilirubin Urine: NEGATIVE
Glucose, UA: NEGATIVE mg/dL
Ketones, ur: 15 mg/dL — AB
LEUKOCYTES UA: NEGATIVE
Nitrite: NEGATIVE
PROTEIN: NEGATIVE mg/dL
SPECIFIC GRAVITY, URINE: 1.025 (ref 1.005–1.030)
pH: 6 (ref 5.0–8.0)

## 2015-09-04 NOTE — MAU Provider Note (Signed)
History     CSN: [id number]  Arrival date and time: 09/04/15 1719   First Provider Initiated Contact with Patient 09/04/15 1940       Chief Complaint  Patient presents with  . Abdominal Pain  . Back Pain   HPI  Leslie Gill is a 20 y.o. G2P1001 at [redacted]w[redacted]d who presents with pelvic & back pain. Pain started 3 hours ago. Comes & goes. Worse with movement and walking. Rates pain 8/10. Has not treated.  Denies vaginal bleeding or LOF. Positive fetal movement.  States she's going out of town to [place] tomorrow for a funeral & wants to make sure "nothing is wrong" before she goes.   OB History    Gravida Para Term Preterm AB TAB SAB Ectopic Multiple Living   Past Medical History  Diagnosis Date  . Pyelonephritis     required hospitalization  . Bladder disorder     "small bladder" Sees MD at [facility]  . Pyelonephritis   . Normal pregnancy, first 02/20/2013  . HA (headache)     Past Surgical History  Procedure Laterality Date  . Peripherally inserted central catheter insertion      for home meds. due to pyelo    Family History  Problem Relation Age of Onset  . Diabetes Maternal Grandfather   . Breast cancer Maternal Grandmother     Social History  Substance Use Topics  . Smoking status: Never Smoker   . Smokeless tobacco: Never Used  . Alcohol Use: No    Allergies: No Known Allergies  Prescriptions prior to admission  Medication Sig Dispense Refill Last Dose  . IRON PO Take 1 tablet by mouth daily.   09/04/2015 at Unknown time  . Prenatal Vit-Fe Fumarate-FA (PRENATAL MULTIVITAMIN) TABS tablet Take 1 tablet by mouth daily at 12 noon.   09/04/2015 at Unknown time  . acetaminophen-codeine (TYLENOL #3) 300-30 MG tablet Take 1-2 tablets by mouth every 6 (six) hours as needed for moderate pain. (Patient not taking: Reported on 09/04/2015) 15 tablet 0     Review of Systems  Constitutional: Negative.   Gastrointestinal: Positive for abdominal pain.   Genitourinary: Negative.   Musculoskeletal: Positive for back pain.   Physical Exam   Blood pressure 120/68, pulse 106, temperature 98.2 F (36.8 C), resp. rate 18, last menstrual period 12/29/2014.  Physical Exam  Nursing note and vitals reviewed. Constitutional: She is oriented to person, place, and time. She appears well-developed and well-nourished. No distress.  HENT:  Head: Normocephalic and atraumatic.  Eyes: Conjunctivae are normal. Right eye exhibits no discharge. Left eye exhibits no discharge. No scleral icterus.  Neck: Normal range of motion.  Cardiovascular: Normal rate, regular rhythm and normal heart sounds.   No murmur heard. Respiratory: Effort normal and breath sounds normal. No respiratory distress. She has no wheezes.  GI: Soft. There is no tenderness.  Neurological: She is alert and oriented to person, place, and time.  Skin: Skin is warm and dry. She is not diaphoretic.  Psychiatric: She has a normal mood and affect. Her behavior is normal. Judgment and thought content normal.   Dilation: 1 Effacement (%): Thick Cervical Position: Posterior Exam by:: E. Tahmir Kleckner,NP  Fetal Tracing:  Baseline: 140 Variability: moderate Accelerations: 15x15 Decelerations: none  Toco: none   MAU Course  Procedures Results for orders placed or performed during the hospital encounter of 09/04/15 (from the past 24 hour(s))  Urinalysis, Routine w reflex microscopic (not [facility])     Status: Abnormal   Collection Time: 09/04/15  6:00 PM  Result Value Ref Range   Color, Urine YELLOW YELLOW   APPearance HAZY (A) CLEAR   Specific Gravity, Urine 1.025 1.005 - 1.030   pH 6.0 5.0 - 8.0   Glucose, UA NEGATIVE NEGATIVE mg/dL   Hgb urine dipstick TRACE (A) NEGATIVE   Bilirubin Urine NEGATIVE NEGATIVE   Ketones, ur 15 (A) NEGATIVE mg/dL   Protein, ur NEGATIVE NEGATIVE mg/dL   Nitrite NEGATIVE NEGATIVE   Leukocytes, UA NEGATIVE NEGATIVE  Urine microscopic-add on     Status:  Abnormal   Collection Time: 09/04/15  6:00 PM  Result Value Ref Range   Squamous Epithelial / LPF 6-30 (A) NONE SEEN   WBC, UA 0-5 0 - 5 WBC/hpf   RBC / HPF 0-5 0 - 5 RBC/hpf   Bacteria, UA FEW (A) NONE SEEN   Urine-Other MUCOUS PRESENT     MDM Reactive tracing No contractions on monitor SVE unchanged after 1 hour Pt in no apparent distress S/w Dr. Ellyn HackBovard regarding patient. Ok to discharge home.  Printed out prenatal records from Epic for patient to take with her   Assessment and Plan  A: 1. Pain of round ligament affecting pregnancy, antepartum     P; Discharge home Preterm labor precautions & fetal kick count form Take prenatal records with you on your trip Take frequent bathroom breaks while driving & drink plenty of water   Judeth Hornrin Branton Einstein 09/04/2015, 7:40 PM

## 2015-09-04 NOTE — Discharge Instructions (Signed)
Fetal Movement Counts Patient Name: __________________________________________________ Patient Due Date: ____________________ Performing a fetal movement count is highly recommended in high-risk pregnancies, but it is good for every pregnant woman to do. Your health care provider may ask you to start counting fetal movements at 28 weeks of the pregnancy. Fetal movements often increase:  After eating a full meal.  After physical activity.  After eating or drinking something sweet or cold.  At rest. Pay attention to when you feel the baby is most active. This will help you notice a pattern of your baby's sleep and wake cycles and what factors contribute to an increase in fetal movement. It is important to perform a fetal movement count at the same time each day when your baby is normally most active.  HOW TO COUNT FETAL MOVEMENTS 1. Find a quiet and comfortable area to sit or lie down on your left side. Lying on your left side provides the best blood and oxygen circulation to your baby. 2. Write down the day and time on a sheet of paper or in a journal. 3. Start counting kicks, flutters, swishes, rolls, or jabs in a 2-hour period. You should feel at least 10 movements within 2 hours. 4. If you do not feel 10 movements in 2 hours, wait 2-3 hours and count again. Look for a change in the pattern or not enough counts in 2 hours. SEEK MEDICAL CARE IF:  You feel less than 10 counts in 2 hours, tried twice.  There is no movement in over an hour.  The pattern is changing or taking longer each day to reach 10 counts in 2 hours.  You feel the baby is not moving as he or she usually does. Date: ____________ Movements: ____________ Start time: ____________ Finish time: ____________  Date: ____________ Movements: ____________ Start time: ____________ Finish time: ____________ Date: ____________ Movements: ____________ Start time: ____________ Finish time: ____________ Date: ____________ Movements:  ____________ Start time: ____________ Finish time: ____________ Date: ____________ Movements: ____________ Start time: ____________ Finish time: ____________ Date: ____________ Movements: ____________ Start time: ____________ Finish time: ____________ Date: ____________ Movements: ____________ Start time: ____________ Finish time: ____________ Date: ____________ Movements: ____________ Start time: ____________ Finish time: ____________  Date: ____________ Movements: ____________ Start time: ____________ Finish time: ____________ Date: ____________ Movements: ____________ Start time: ____________ Finish time: ____________ Date: ____________ Movements: ____________ Start time: ____________ Finish time: ____________ Date: ____________ Movements: ____________ Start time: ____________ Finish time: ____________ Date: ____________ Movements: ____________ Start time: ____________ Finish time: ____________ Date: ____________ Movements: ____________ Start time: ____________ Finish time: ____________ Date: ____________ Movements: ____________ Start time: ____________ Finish time: ____________  Date: ____________ Movements: ____________ Start time: ____________ Finish time: ____________ Date: ____________ Movements: ____________ Start time: ____________ Finish time: ____________ Date: ____________ Movements: ____________ Start time: ____________ Finish time: ____________ Date: ____________ Movements: ____________ Start time: ____________ Finish time: ____________ Date: ____________ Movements: ____________ Start time: ____________ Finish time: ____________ Date: ____________ Movements: ____________ Start time: ____________ Finish time: ____________ Date: ____________ Movements: ____________ Start time: ____________ Finish time: ____________  Date: ____________ Movements: ____________ Start time: ____________ Finish time: ____________ Date: ____________ Movements: ____________ Start time: ____________ Finish  time: ____________ Date: ____________ Movements: ____________ Start time: ____________ Finish time: ____________ Date: ____________ Movements: ____________ Start time: ____________ Finish time: ____________ Date: ____________ Movements: ____________ Start time: ____________ Finish time: ____________ Date: ____________ Movements: ____________ Start time: ____________ Finish time: ____________ Date: ____________ Movements: ____________ Start time: ____________ Finish time: ____________  Date: ____________ Movements: ____________ Start time: ____________ Finish   time: ____________ Date: ____________ Movements: ____________ Start time: ____________ Finish time: ____________ Date: ____________ Movements: ____________ Start time: ____________ Finish time: ____________ Date: ____________ Movements: ____________ Start time: ____________ Finish time: ____________ Date: ____________ Movements: ____________ Start time: ____________ Finish time: ____________ Date: ____________ Movements: ____________ Start time: ____________ Finish time: ____________ Date: ____________ Movements: ____________ Start time: ____________ Finish time: ____________  Date: ____________ Movements: ____________ Start time: ____________ Finish time: ____________ Date: ____________ Movements: ____________ Start time: ____________ Finish time: ____________ Date: ____________ Movements: ____________ Start time: ____________ Finish time: ____________ Date: ____________ Movements: ____________ Start time: ____________ Finish time: ____________ Date: ____________ Movements: ____________ Start time: ____________ Finish time: ____________ Date: ____________ Movements: ____________ Start time: ____________ Finish time: ____________ Date: ____________ Movements: ____________ Start time: ____________ Finish time: ____________  Date: ____________ Movements: ____________ Start time: ____________ Finish time: ____________ Date: ____________  Movements: ____________ Start time: ____________ Finish time: ____________ Date: ____________ Movements: ____________ Start time: ____________ Finish time: ____________ Date: ____________ Movements: ____________ Start time: ____________ Finish time: ____________ Date: ____________ Movements: ____________ Start time: ____________ Finish time: ____________ Date: ____________ Movements: ____________ Start time: ____________ Finish time: ____________ Date: ____________ Movements: ____________ Start time: ____________ Finish time: ____________  Date: ____________ Movements: ____________ Start time: ____________ Finish time: ____________ Date: ____________ Movements: ____________ Start time: ____________ Finish time: ____________ Date: ____________ Movements: ____________ Start time: ____________ Finish time: ____________ Date: ____________ Movements: ____________ Start time: ____________ Finish time: ____________ Date: ____________ Movements: ____________ Start time: ____________ Finish time: ____________ Date: ____________ Movements: ____________ Start time: ____________ Finish time: ____________   This information is not intended to replace advice given to you by your health care provider. Make sure you discuss any questions you have with your health care provider.   Document Released: 04/28/2006 Document Revised: 04/19/2014 Document Reviewed: 01/24/2012 Elsevier Interactive Patient Education 2016 [employer] Preterm Labor Information Preterm labor is when labor starts at less than 37 weeks of pregnancy. The normal length of a pregnancy is 39 to 41 weeks. CAUSES Often, there is no identifiable underlying cause as to why a woman goes into preterm labor. One of the most common known causes of preterm labor is infection. Infections of the uterus, cervix, vagina, amniotic sac, bladder, kidney, or even the lungs (pneumonia) can cause labor to start. Other suspected causes of preterm labor include:    Urogenital infections, such as yeast infections and bacterial vaginosis.   Uterine abnormalities (uterine shape, uterine septum, fibroids, or bleeding from the placenta).   A cervix that has been operated on (it may fail to stay closed).   Malformations in the fetus.   Multiple gestations (twins, triplets, and so on).   Breakage of the amniotic sac.  RISK FACTORS 5. Having a previous history of preterm labor.  6. Having premature rupture of membranes (PROM).  7. Having a placenta that covers the opening of the cervix (placenta previa).  8. Having a placenta that separates from the uterus (placental abruption).  9. Having a cervix that is too weak to hold the fetus in the uterus (incompetent cervix).  10. Having too much fluid in the amniotic sac (polyhydramnios).  11. Taking illegal drugs or smoking while pregnant.  12. Not gaining enough weight while pregnant.  13. Being younger than 18 and older than 20 years old.  14. Having a low socioeconomic status.  15. Being African American. SYMPTOMS Signs and symptoms of preterm labor include:   Menstrual-like cramps, abdominal pain, or back pain.  Uterine contractions that are regular, as   frequent as six in an hour, regardless of their intensity (may be mild or painful).  Contractions that start on the top of the uterus and spread down to the lower abdomen and back.   A sense of increased pelvic pressure.   A watery or bloody mucus discharge that comes from the vagina.  TREATMENT Depending on the length of the pregnancy and other circumstances, your health care provider may suggest bed rest. If necessary, there are medicines that can be given to stop contractions and to mature the fetal lungs. If labor happens before 34 weeks of pregnancy, a prolonged hospital stay may be recommended. Treatment depends on the condition of both you and the fetus.  WHAT SHOULD YOU DO IF YOU THINK YOU ARE IN PRETERM LABOR? Call  your health care provider right away. You will need to go to the hospital to get checked immediately. HOW CAN YOU PREVENT PRETERM LABOR IN FUTURE PREGNANCIES? You should:   Stop smoking if you smoke.  Maintain healthy weight gain and avoid chemicals and drugs that are not necessary.  Be watchful for any type of infection.  Inform your health care provider if you have a known history of preterm labor.   This information is not intended to replace advice given to you by your health care provider. Make sure you discuss any questions you have with your health care provider.   Document Released: 06/19/2003 Document Revised: 11/29/2012 Document Reviewed: 05/01/2012 Elsevier Interactive Patient Education 2016 Elsevier Inc.  

## 2015-09-04 NOTE — MAU Note (Signed)
Pt presents to MAU with complaints of back pain and lower abdominal pain for three hours. Pt denies any vaginal bleeding or LOF

## 2015-09-12 LAB — OB RESULTS CONSOLE GBS: STREP GROUP B AG: NEGATIVE

## 2015-09-24 ENCOUNTER — Telehealth (HOSPITAL_COMMUNITY): Payer: Self-pay | Admitting: *Deleted

## 2015-09-24 ENCOUNTER — Encounter (HOSPITAL_COMMUNITY): Payer: Self-pay | Admitting: *Deleted

## 2015-09-24 NOTE — Telephone Encounter (Signed)
Preadmission screen  

## 2015-09-29 ENCOUNTER — Telehealth (HOSPITAL_COMMUNITY): Payer: Self-pay | Admitting: General Practice

## 2015-09-29 ENCOUNTER — Encounter (HOSPITAL_COMMUNITY): Payer: Self-pay | Admitting: General Practice

## 2015-10-06 ENCOUNTER — Inpatient Hospital Stay (HOSPITAL_COMMUNITY): Admission: RE | Admit: 2015-10-06 | Payer: Medicaid Other | Source: Ambulatory Visit

## 2015-10-08 ENCOUNTER — Encounter (HOSPITAL_COMMUNITY): Payer: Self-pay

## 2015-10-08 DIAGNOSIS — Z3483 Encounter for supervision of other normal pregnancy, third trimester: Secondary | ICD-10-CM

## 2015-10-08 HISTORY — DX: Encounter for supervision of other normal pregnancy, third trimester: Z34.83

## 2015-10-08 NOTE — H&P (Signed)
Leslie Gill is a 20 y.o. female G2P1001 at 39+ for IOL given term status and favorable cervix.  +FM, no LOF, no VB, occ ctx.Pregnancy complicated by being treated for flu with Tamiflu in early pregnancy,  Also some spotting and bleeding in early pregnancy. Pregnancy dated by first trimester US.  Normal genetic screening.    Maternal Medical History:  Contractions: Frequency: irregular.    Fetal activity: Perceived fetal activity is normal.    Prenatal Complications - Diabetes: none.    OB History    Gravida Para Term Preterm AB TAB SAB Ectopic Multiple Living   2 1 1       1     no pap, no STD G1 SVD term 7#11 female G2 present  Past Medical History  Diagnosis Date  . Pyelonephritis     required hospitalization  . Bladder disorder     "small bladder" [facility]  . Pyelonephritis   . Normal pregnancy, first 02/20/2013  . HA (headache)   . Hx of pyelonephritis    Past Surgical History  Procedure Laterality Date  . Peripherally inserted central catheter insertion      for home meds. due to pyelo  . Wisdom tooth extraction     Family History: family history includes Breast cancer in her maternal grandmother; Diabetes in her maternal grandfather.HTN Social History:  reports that she has never smoked. She has never used smokeless tobacco. She reports that she does not drink alcohol or use illicit drugs. 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   HENT: Negative.   Eyes: Negative.    Respiratory: Negative.   Cardiovascular: Negative.   Gastrointestinal: Negative.   Genitourinary: Negative.   Musculoskeletal: Positive for myalgias.  Skin: Negative.   Neurological: Negative.   Psychiatric/Behavioral: Negative.       Last menstrual period 12/29/2014. Maternal Exam:  Abdomen: Patient reports no abdominal tenderness. Fundal height is appropriate for gestation.   Estimated fetal weight is 7.5-8.5#.   Fetal presentation: vertex  Introitus: Normal vulva. Normal vagina.  Cervix: Cervix evaluated by digital exam.     Physical Exam  Constitutional: She is oriented to person, place, and time. She appears well-developed and well-nourished.  HENT:  Head: Normocephalic and atraumatic.  Cardiovascular: Normal rate and regular rhythm.   Respiratory: Effort normal and breath sounds normal. No respiratory distress. She has no wheezes.  GI: Soft. Bowel sounds are normal. She exhibits no distension. There is no tenderness.  Musculoskeletal: Normal range of motion.  Neurological: She is alert and oriented to person, place, and time.  Skin: Skin is warm and dry.  Psychiatric: She has a normal mood and affect. Her behavior is normal.    Prenatal labs: ABO, Rh: A/Positive/-- (11/21 0000) Antibody: Negative (11/21 0000) Rubella: Immune (11/21 0000) RPR: Nonreactive (11/21 0000)  HBsAg: Negative (11/21 0000)  HIV: Non-reactive (11/21 0000)  GBS:   negative  Hgb 13.1/Plt 345/Ur Cx neg/GC neg/Chl neg/Varicella immune/ First Trimester and AFP WNL/glucola 122/  SVE 3.4cm  Nl anat, ant plac, female Tdap 07/17/15 CF neg Dated by  first trimester US, nl NT Dated by first trimester US   Assessment/Plan: 20yo G2P1 at 39+ for IOL AROM and pitocin to augment Epidural, Nitrous Oxide or epidural for pain Expect SVD   Bovard-Stuckert, Marissa Lowrey 10/08/2015, 8:37 PM

## 2015-10-09 ENCOUNTER — Inpatient Hospital Stay (HOSPITAL_COMMUNITY)
Admission: RE | Admit: 2015-10-09 | Discharge: 2015-10-10 | DRG: 775 | Disposition: A | Discharge status: Home / Self Care | Payer: Medicaid Other | Source: Ambulatory Visit | Attending: Obstetrics and Gynecology | Admitting: Obstetrics and Gynecology

## 2015-10-09 ENCOUNTER — Inpatient Hospital Stay (HOSPITAL_COMMUNITY): Payer: Medicaid Other | Admitting: Anesthesiology

## 2015-10-09 ENCOUNTER — Encounter (HOSPITAL_COMMUNITY): Payer: Self-pay

## 2015-10-09 VITALS — BP 125/67 | HR 78 | Temp 98.0°F | Resp 18 | Ht 62.0 in | Wt 207.0 lb

## 2015-10-09 DIAGNOSIS — Z8249 Family history of ischemic heart disease and other diseases of the circulatory system: Secondary | ICD-10-CM | POA: Diagnosis not present

## 2015-10-09 DIAGNOSIS — Z833 Family history of diabetes mellitus: Secondary | ICD-10-CM | POA: Diagnosis not present

## 2015-10-09 DIAGNOSIS — Z3A39 39 weeks gestation of pregnancy: Secondary | ICD-10-CM | POA: Diagnosis not present

## 2015-10-09 DIAGNOSIS — Z803 Family history of malignant neoplasm of breast: Secondary | ICD-10-CM

## 2015-10-09 DIAGNOSIS — Z3483 Encounter for supervision of other normal pregnancy, third trimester: Secondary | ICD-10-CM

## 2015-10-09 HISTORY — DX: Encounter for supervision of other normal pregnancy, third trimester: Z34.83

## 2015-10-09 LAB — CBC
HCT: 30.7 % — ABNORMAL LOW (ref 36.0–46.0)
HEMOGLOBIN: 10.4 g/dL — AB (ref 12.0–15.0)
MCH: 27.7 pg (ref 26.0–34.0)
MCHC: 33.9 g/dL (ref 30.0–36.0)
MCV: 81.9 fL (ref 78.0–100.0)
PLATELETS: 214 10*3/uL (ref 150–400)
RBC: 3.75 MIL/uL — AB (ref 3.87–5.11)
RDW: 14.8 % (ref 11.5–15.5)
WBC: 9.8 10*3/uL (ref 4.0–10.5)

## 2015-10-09 LAB — RPR: RPR: NONREACTIVE

## 2015-10-09 LAB — TYPE AND SCREEN
ABO/RH(D): A POS
ANTIBODY SCREEN: NEGATIVE

## 2015-10-09 MED ORDER — LIDOCAINE HCL (PF) 1 % IJ SOLN
INTRAMUSCULAR | Status: DC | PRN
Start: 2015-10-09 — End: 2015-10-09
  Administered 2015-10-09: 8 mL via EPIDURAL
  Administered 2015-10-09: 6 mL via EPIDURAL

## 2015-10-09 MED ORDER — IBUPROFEN 600 MG PO TABS
600.0000 mg | ORAL_TABLET | Freq: Four times a day (QID) | ORAL | Status: DC
  Administered 2015-10-09 – 2015-10-10 (×5): 600 mg via ORAL
  Filled 2015-10-09 (×5): qty 1

## 2015-10-09 MED ORDER — SOD CITRATE-CITRIC ACID 500-334 MG/5ML PO SOLN: 30.0000 mL | ORAL | Status: DC | PRN

## 2015-10-09 MED ORDER — ACETAMINOPHEN 325 MG PO TABS: 650.0000 mg | ORAL_TABLET | ORAL | Status: DC | PRN

## 2015-10-09 MED ORDER — LACTATED RINGERS IV SOLN: 500.0000 mL | Freq: Once | INTRAVENOUS | Status: DC

## 2015-10-09 MED ORDER — OXYCODONE HCL 5 MG PO TABS: 10.0000 mg | ORAL_TABLET | ORAL | Status: DC | PRN

## 2015-10-09 MED ORDER — EPHEDRINE 5 MG/ML INJ
10.0000 mg | INTRAVENOUS | Status: DC | PRN
  Filled 2015-10-09: qty 2

## 2015-10-09 MED ORDER — ZOLPIDEM TARTRATE 5 MG PO TABS: 5.0000 mg | ORAL_TABLET | Freq: Every evening | ORAL | Status: DC | PRN

## 2015-10-09 MED ORDER — BUTORPHANOL TARTRATE 1 MG/ML IJ SOLN
1.0000 mg | INTRAMUSCULAR | Status: DC | PRN
  Administered 2015-10-09: 1 mg via INTRAVENOUS
  Filled 2015-10-09: qty 1

## 2015-10-09 MED ORDER — ONDANSETRON HCL 4 MG PO TABS: 4.0000 mg | ORAL_TABLET | ORAL | Status: DC | PRN

## 2015-10-09 MED ORDER — OXYCODONE HCL 5 MG PO TABS: 5.0000 mg | ORAL_TABLET | ORAL | Status: DC | PRN

## 2015-10-09 MED ORDER — OXYTOCIN 40 UNITS IN LACTATED RINGERS INFUSION - SIMPLE MED: 2.5000 [IU]/h | INTRAVENOUS | Status: DC

## 2015-10-09 MED ORDER — LACTATED RINGERS IV SOLN
INTRAVENOUS | Status: DC
  Administered 2015-10-09: 08:00:00 via INTRAVENOUS

## 2015-10-09 MED ORDER — OXYTOCIN 40 UNITS IN LACTATED RINGERS INFUSION - SIMPLE MED
1.0000 m[IU]/min | INTRAVENOUS | Status: DC
  Administered 2015-10-09: 2 m[IU]/min via INTRAVENOUS
  Filled 2015-10-09: qty 1000

## 2015-10-09 MED ORDER — PHENYLEPHRINE 40 MCG/ML (10ML) SYRINGE FOR IV PUSH (FOR BLOOD PRESSURE SUPPORT)
80.0000 ug | PREFILLED_SYRINGE | INTRAVENOUS | Status: DC | PRN
  Filled 2015-10-09: qty 5

## 2015-10-09 MED ORDER — DIPHENHYDRAMINE HCL 25 MG PO CAPS: 25.0000 mg | ORAL_CAPSULE | Freq: Four times a day (QID) | ORAL | Status: DC | PRN

## 2015-10-09 MED ORDER — LACTATED RINGERS IV SOLN: INTRAVENOUS | Status: DC

## 2015-10-09 MED ORDER — ONDANSETRON HCL 4 MG/2ML IJ SOLN: 4.0000 mg | Freq: Four times a day (QID) | INTRAMUSCULAR | Status: DC | PRN

## 2015-10-09 MED ORDER — LACTATED RINGERS IV SOLN: 500.0000 mL | INTRAVENOUS | Status: DC | PRN

## 2015-10-09 MED ORDER — TERBUTALINE SULFATE 1 MG/ML IJ SOLN
0.2500 mg | Freq: Once | INTRAMUSCULAR | Status: DC | PRN
  Filled 2015-10-09: qty 1

## 2015-10-09 MED ORDER — ONDANSETRON HCL 4 MG/2ML IJ SOLN: 4.0000 mg | INTRAMUSCULAR | Status: DC | PRN

## 2015-10-09 MED ORDER — WITCH HAZEL-GLYCERIN EX PADS: 1.0000 "application " | MEDICATED_PAD | CUTANEOUS | Status: DC | PRN

## 2015-10-09 MED ORDER — SENNOSIDES-DOCUSATE SODIUM 8.6-50 MG PO TABS
2.0000 | ORAL_TABLET | ORAL | Status: DC
  Administered 2015-10-10: 2 via ORAL
  Filled 2015-10-09: qty 2

## 2015-10-09 MED ORDER — COCONUT OIL OIL: 1.0000 "application " | TOPICAL_OIL | Status: DC | PRN

## 2015-10-09 MED ORDER — SIMETHICONE 80 MG PO CHEW
80.0000 mg | CHEWABLE_TABLET | ORAL | Status: DC | PRN
Start: 2015-10-09 — End: 2015-10-10

## 2015-10-09 MED ORDER — OXYCODONE-ACETAMINOPHEN 5-325 MG PO TABS: 1.0000 | ORAL_TABLET | ORAL | Status: DC | PRN

## 2015-10-09 MED ORDER — DIPHENHYDRAMINE HCL 50 MG/ML IJ SOLN: 12.5000 mg | INTRAMUSCULAR | Status: DC | PRN

## 2015-10-09 MED ORDER — PHENYLEPHRINE 40 MCG/ML (10ML) SYRINGE FOR IV PUSH (FOR BLOOD PRESSURE SUPPORT)
80.0000 ug | PREFILLED_SYRINGE | INTRAVENOUS | Status: DC | PRN
  Filled 2015-10-09: qty 10
  Filled 2015-10-09: qty 5

## 2015-10-09 MED ORDER — DIBUCAINE 1 % RE OINT: 1.0000 "application " | TOPICAL_OINTMENT | RECTAL | Status: DC | PRN

## 2015-10-09 MED ORDER — PRENATAL MULTIVITAMIN CH
1.0000 | ORAL_TABLET | Freq: Every day | ORAL | Status: DC
  Administered 2015-10-10: 1 via ORAL
  Filled 2015-10-09: qty 1

## 2015-10-09 MED ORDER — BENZOCAINE-MENTHOL 20-0.5 % EX AERO: 1.0000 "application " | INHALATION_SPRAY | CUTANEOUS | Status: DC | PRN

## 2015-10-09 MED ORDER — OXYTOCIN BOLUS FROM INFUSION: 500.0000 mL | INTRAVENOUS | Status: DC

## 2015-10-09 MED ORDER — OXYCODONE-ACETAMINOPHEN 5-325 MG PO TABS: 2.0000 | ORAL_TABLET | ORAL | Status: DC | PRN

## 2015-10-09 MED ORDER — LIDOCAINE HCL (PF) 1 % IJ SOLN
30.0000 mL | INTRAMUSCULAR | Status: DC | PRN
  Filled 2015-10-09: qty 30

## 2015-10-09 MED ORDER — FENTANYL 2.5 MCG/ML BUPIVACAINE 1/10 % EPIDURAL INFUSION (WH - ANES)
14.0000 mL/h | INTRAMUSCULAR | Status: DC | PRN
  Administered 2015-10-09: 14 mL/h via EPIDURAL
  Filled 2015-10-09: qty 125

## 2015-10-09 NOTE — Anesthesia Procedure Notes (Signed)
Epidural Patient location during procedure: OB Start time: 10/09/2015 9:40 AM End time: 10/09/2015 9:44 AM  Staffing Anesthesiologist: Leilani AbleHATCHETT, Lizza Huffaker Performed by: anesthesiologist   Preanesthetic Checklist Completed: patient identified, surgical consent, pre-op evaluation, timeout performed, IV checked, risks and benefits discussed and monitors and equipment checked  Epidural Patient position: sitting Prep: site prepped and draped and DuraPrep Patient monitoring: continuous pulse ox and blood pressure Approach: midline Location: L3-L4 Injection technique: LOR air  Needle:  Needle type: Tuohy  Needle gauge: 17 G Needle length: 9 cm and 9 Needle insertion depth: 5 cm cm Catheter type: closed end flexible Catheter size: 19 Gauge Catheter at skin depth: 10 cm Test dose: negative and Other  Assessment Sensory level: T9 Events: blood not aspirated, injection not painful, no injection resistance, negative IV test and no paresthesia  Additional Notes Reason for block:procedure for pain

## 2015-10-09 NOTE — Progress Notes (Signed)
Patient ID: Kevin FentonMaggie M Gill, female   DOB: 11/27/1995, 20 y.o.   MRN: [id number]   H&P reviewed no changes  AFVSS gen NAD FHTs 140's, mod var, category 1 toco q 6min  SVE 4.5/50/-2  AROM for clear fluid  Continue IOL - AROM and pitocin Expect SVD

## 2015-10-09 NOTE — Anesthesia Preprocedure Evaluation (Signed)
Anesthesia Evaluation  Patient identified by MRN, date of birth, ID band Patient awake    Reviewed: Allergy & Precautions, H&P , NPO status , Patient's Chart, lab work & pertinent test results  Airway Mallampati: II  TM Distance: >3 FB Neck ROM: full    Dental no notable dental hx.    Pulmonary neg pulmonary ROS,    Pulmonary exam normal        Cardiovascular negative cardio ROS Normal cardiovascular exam     Neuro/Psych negative psych ROS   GI/Hepatic negative GI ROS, Neg liver ROS,   Endo/Other  negative endocrine ROS  Renal/GU      Musculoskeletal   Abdominal (+) + obese,   Peds  Hematology negative hematology ROS (+)   Anesthesia Other Findings   Reproductive/Obstetrics (+) Pregnancy                             Anesthesia Physical Anesthesia Plan  ASA: II  Anesthesia Plan: Epidural   Post-op Pain Management:    Induction:   Airway Management Planned:   Additional Equipment:   Intra-op Plan:   Post-operative Plan:   Informed Consent: I have reviewed the patients History and Physical, chart, labs and discussed the procedure including the risks, benefits and alternatives for the proposed anesthesia with the patient or authorized representative who has indicated his/her understanding and acceptance.     Plan Discussed with:   Anesthesia Plan Comments:         Anesthesia Quick Evaluation

## 2015-10-09 NOTE — Anesthesia Pain Management Evaluation Note (Signed)
  CRNA Pain Management Visit Note  Patient: Leslie Gill, 20 y.o., female  "Hello I am a member of the anesthesia team at [facility]. We have an anesthesia team available at all times to provide care throughout the hospital, including epidural management and anesthesia for C-section. I don't know your plan for the delivery whether it a natural birth, water birth, IV sedation, nitrous supplementation, doula or epidural, but we want to meet your pain goals."   1.Was your pain managed to your expectations on prior hospitalizations?   Unable to assess - patient sleeping  2.What is your expectation for pain management during this hospitalization?     Epidural  3.How can we help you reach that goal? Epidural in situ  Record the patient's initial score and the patient's pain goal.   Pain: 0  Pain Goal: 8 [facility] wants you to be able to say your pain was always managed very well.  Cephus ShellingBURGER,[name] 10/09/2015

## 2015-10-10 LAB — CBC
HCT: 28.4 % — ABNORMAL LOW (ref 36.0–46.0)
Hemoglobin: 9.4 g/dL — ABNORMAL LOW (ref 12.0–15.0)
MCH: 27.2 pg (ref 26.0–34.0)
MCHC: 33.1 g/dL (ref 30.0–36.0)
MCV: 82.3 fL (ref 78.0–100.0)
PLATELETS: 212 10*3/uL (ref 150–400)
RBC: 3.45 MIL/uL — AB (ref 3.87–5.11)
RDW: 14.8 % (ref 11.5–15.5)
WBC: 10.6 10*3/uL — ABNORMAL HIGH (ref 4.0–10.5)

## 2015-10-10 MED ORDER — IBUPROFEN 600 MG PO TABS: 600.0000 mg | ORAL_TABLET | Freq: Four times a day (QID) | ORAL | Status: DC

## 2015-10-10 NOTE — Discharge Summary (Signed)
OB Discharge Summary     Patient Name: Leslie Gill DOB: 01/06/1996 MRN: [id number]  Date of admission: 10/09/2015 Delivering MD: Sherian ReinBOVARD-STUCKERT, JODY   Date of discharge: 10/10/2015  Admitting diagnosis: INDUCTION Intrauterine pregnancy: 83110w4d     Secondary diagnosis:  Principal Problem:   SVD (spontaneous vaginal delivery) Active Problems:   Normal pregnancy in multigravida in third trimester      Discharge diagnosis: Term Pregnancy Delivered                                                                                                Augmentation: AROM and Pitocin  Complications: None  Hospital course:  Induction of Labor With Vaginal Delivery   20 y.o. yo [id number] at 65110w4d was admitted to the hospital 10/09/2015 for induction of labor.  Indication for induction: Favorable cervix at term.  Patient had an uncomplicated labor course as follows: Membrane Rupture Time/Date: 8:14 AM ,10/09/2015   Intrapartum Procedures: Episiotomy: None [1]                                         Lacerations:  1st degree [2];Perineal [11];Labial [10]  Patient had delivery of a Viable infant.  Information for the patient's newborn:  Leslie Gill, Boy Leslie Gill [[id number]][[id number]]  Delivery Method: Vaginal, Spontaneous Delivery (Filed from Delivery Summary)   10/09/2015  Details of delivery can be found in separate delivery note.  Patient had a routine postpartum course. Patient is discharged home 10/10/2015.   Physical exam  Filed Vitals:   10/09/15 1418 10/09/15 1720 10/09/15 2112 10/10/15 0500  BP: 110/62 119/59 120/59 125/67  Pulse: 80 98 73 78  Temp:  98.2 F (36.8 C) 98.1 F (36.7 C) 98 F (36.7 C)  TempSrc:  Oral Oral Oral  Resp:  16 16 18   Height:      Weight:      SpO2:  98%  99%   General: alert Lochia: appropriate Uterine Fundus: firm  Labs: Lab Results  Component Value Date   WBC 10.6* 10/10/2015   HGB 9.4* 10/10/2015   HCT 28.4* 10/10/2015   MCV 82.3 10/10/2015   PLT 212  10/10/2015   CMP Latest Ref Rng 04/07/2010  Glucose 70 - 99 mg/dL -  BUN 6 - 23 mg/dL 8  Creatinine 0.4 - 1.2 mg/dL 4.690.55  Sodium 629135 - 528145 mEq/L -  Potassium 3.5 - 5.1 mEq/L -  Chloride 96 - 112 mEq/L -  CO2 19 - 32 mEq/L -  Calcium 8.4 - 10.5 mg/dL -  Total Bilirubin 0.3 - 1.2 mg/dL -  Alkaline Phos 50 - 413162 U/L -  AST 0 - 37 U/L -  ALT 0 - 35 U/L -    Discharge instruction: per After Visit Summary and "Baby and Me Booklet".  After visit meds:    Medication List    TAKE these medications        ferrous sulfate 325 (65 FE) MG tablet  Take 325 mg by mouth daily with breakfast.  ibuprofen 600 MG tablet  Commonly known as:  ADVIL,MOTRIN  Take 1 tablet (600 mg total) by mouth every 6 (six) hours.     prenatal multivitamin Tabs tablet  Take 1 tablet by mouth daily at 12 noon.        Diet: routine diet  Activity: Advance as tolerated. Pelvic rest for 6 weeks.   Outpatient follow up:6 weeks   Newborn Data: Live born female  Birth Weight: 7 lb 15 oz (3600 g) APGAR: 9, 9  Baby Feeding: Breast Disposition:home with mother   10/10/2015 Zenaida NieceMEISINGER,Grey Rakestraw D, MD

## 2015-10-10 NOTE — Progress Notes (Signed)
PPD #1 No problems, wants to go home Afeb, VSS Fundus firm, NT at U-1 Continue routine postpartum care, d/c home this pm 

## 2015-10-10 NOTE — Lactation Note (Addendum)
This note was copied from a baby's chart. Lactation Consultation Note  Patient Name: Leslie Gill Reason for consult: Initial assessment  Baby is 23 hours old and for early D/C . Per  Mom has been breast feeding in the cradle position .  Baby awake and rooting . LC assisted mom to switch position to football to give her adequate support  Due to large breast. @ latch worked on depth and getting baby to open wide, breast compressions .  Mom has steady flow of colostrum and baby sustained latch for 8 mins, multiply swallows, increased with  Breast compressions. Baby released at 8 mins , nipple well rounded and baby satisfied.  LC recommended to mom prior to latch on the 1st breast after breast massage , hand express, pre - pump  To make the nipple more erect to make latch easier . Areola is compressible and latch able.  LC stressed th importance of consistently obtaining the depth and showed her how dad could help over the  Weekend until the baby is learning to latch well.  Per mom had challenges with engorgement with her 1st baby, denies soreness. Sore nipple and engorgement prevention  And tx reviewed. LC set up a hand up and increased flange to #27 for when milk comes in.  Mother informed of post-discharge support and given phone number to the lactation department, including services for phone  call assistance; out-patient appointments; and breastfeeding support group. List of other breastfeeding resources in the community  given in the handout. Encouraged mother to call for problems or concerns related to breastfeeding. LC reviewed doc flow sheets - Breast fed several times, voids and stools Qs for age, Latch scores 5-7- 9 at consult.  There are several 10 mins feedings - LC suspects it is due to steady flow of colostrum, and possible lack of depth at some feedings.     Maternal Data Has patient been taught Hand Expression?: Yes (steady flow of colostrum noted  ) Does the patient have breastfeeding experience prior to this delivery?: Yes  Feeding Feeding Type: Breast Fed Length of feed: 8 min (multiply swallows noted, increased with breast compressions, baby released , satisfied )  LATCH Score/Interventions Latch: Grasps breast easily, tongue down, lips flanged, rhythmical sucking. Intervention(s): Adjust position;Assist with latch;Breast massage;Breast compression  Audible Swallowing: Spontaneous and intermittent  Type of Nipple: Everted at rest and after stimulation  Comfort (Breast/Nipple): Soft / non-tender     Hold (Positioning): Assistance needed to correctly position infant at breast and maintain latch. Intervention(s): Breastfeeding basics reviewed;Support Pillows;Position options;Skin to skin  LATCH Score: 9  Lactation Tools Discussed/Used Tools: Pump;Flanges Flange Size: 27 Breast pump type: Manual WIC Program: No Pump Review: Setup, frequency, and cleaning Initiated by:: MAI  Date initiated:: 10/10/15   Consult Status Consult Status: Follow-up Date: 10/10/15 Follow-up type: In-patient    Kathrin Greathouseorio, Bryanda Mikel [name], 1:46 PM

## 2015-10-10 NOTE — Discharge Instructions (Signed)
As per discharge pamphlet °

## 2015-10-10 NOTE — Progress Notes (Signed)
UR chart review completed.  

## 2015-10-10 NOTE — Anesthesia Postprocedure Evaluation (Signed)
Anesthesia Post Note  Patient: Leslie FentonMaggie M Gill  Procedure(s) Performed: * No procedures listed *  Patient location during evaluation: Mother Baby Anesthesia Type: Epidural Level of consciousness: awake Pain management: pain level controlled Vital Signs Assessment: post-procedure vital signs reviewed and stable Respiratory status: spontaneous breathing Cardiovascular status: stable Postop Assessment: no headache, no backache, epidural receding and patient able to bend at knees Anesthetic complications: no     Last Vitals:  Filed Vitals:   10/09/15 2112 10/10/15 0500  BP: 120/59 125/67  Pulse: 73 78  Temp: 36.7 C 36.7 C  Resp: 16 18    Last Pain:  Filed Vitals:   10/10/15 0607  PainSc: 3    Pain Goal: Patients Stated Pain Goal: 3 (10/09/15 1909)               Edison PaceWILKERSON,Cherika Jessie

## 2016-04-19 ENCOUNTER — Encounter (HOSPITAL_COMMUNITY): Payer: Self-pay | Admitting: *Deleted

## 2016-04-19 ENCOUNTER — Inpatient Hospital Stay (HOSPITAL_COMMUNITY)
Admission: AD | Admit: 2016-04-19 | Discharge: 2016-04-20 | Disposition: A | Discharge status: Home / Self Care | Payer: Medicaid Other | Source: Ambulatory Visit | Attending: Family Medicine | Admitting: Family Medicine

## 2016-04-19 DIAGNOSIS — B373 Candidiasis of vulva and vagina: Secondary | ICD-10-CM | POA: Diagnosis not present

## 2016-04-19 DIAGNOSIS — B3731 Acute candidiasis of vulva and vagina: Secondary | ICD-10-CM

## 2016-04-19 DIAGNOSIS — R102 Pelvic and perineal pain: Secondary | ICD-10-CM | POA: Insufficient documentation

## 2016-04-19 DIAGNOSIS — Z3202 Encounter for pregnancy test, result negative: Secondary | ICD-10-CM | POA: Insufficient documentation

## 2016-04-19 LAB — URINALYSIS, ROUTINE W REFLEX MICROSCOPIC
Bilirubin Urine: NEGATIVE
Glucose, UA: NEGATIVE mg/dL
Hgb urine dipstick: NEGATIVE
Ketones, ur: NEGATIVE mg/dL
Nitrite: NEGATIVE
PH: 6 (ref 5.0–8.0)
Protein, ur: 30 mg/dL — AB
SPECIFIC GRAVITY, URINE: 1.025 (ref 1.005–1.030)

## 2016-04-19 NOTE — MAU Note (Signed)
PT  SAYS    ON  SAT   -  SHE HAD VAG  ITCHING-    APPLIED ICE.  AT 7 PM-    SAW VAG   SWOLLEN   .     SHE DID CALL OFFICE   ABOUT  C/O     SHE 'S  ON  INPLANON   FOR  BC.     LAST SEX-  FRIDAY

## 2016-04-20 DIAGNOSIS — B373 Candidiasis of vulva and vagina: Secondary | ICD-10-CM | POA: Diagnosis not present

## 2016-04-20 LAB — WET PREP, GENITAL
CLUE CELLS WET PREP: NONE SEEN
Sperm: NONE SEEN
Trich, Wet Prep: NONE SEEN

## 2016-04-20 LAB — POCT PREGNANCY, URINE: Preg Test, Ur: NEGATIVE

## 2016-04-20 MED ORDER — FLUCONAZOLE 150 MG PO TABS: 150.0000 mg | ORAL_TABLET | Freq: Once | ORAL | 0 refills | Status: AC

## 2016-04-20 NOTE — Discharge Instructions (Signed)

## 2016-04-20 NOTE — MAU Provider Note (Signed)
History     CSN: [id number]  Arrival date and time: 04/19/16 2148   First Provider Initiated Contact with Patient 04/20/16 0001      Chief Complaint  Patient presents with  . Vaginal Pain   Vaginal Pain  The patient's primary symptoms include genital itching. The patient's pertinent negatives include no vaginal discharge. This is a new problem. The current episode started in the past 7 days. The problem occurs constantly. The problem has been gradually worsening. Pain severity now: 5/10  The problem affects both sides. She is not pregnant. Associated symptoms include dysuria. Pertinent negatives include no chills, fever, nausea, urgency or vomiting. The vaginal discharge was normal. There has been no bleeding. Nothing aggravates the symptoms. Treatments tried: used monistat this morning prior to the swelling starting. The treatment provided no relief. She is sexually active. Contraceptive use: Nexplanon  Her menstrual history has been irregular (02/06/16 ).   Past Medical History:  Diagnosis Date  . Bladder disorder    "small bladder" Sees MD at [facility]  . HA (headache)   . Hx of pyelonephritis   . Normal pregnancy in multigravida in third trimester 10/08/2015  . Normal pregnancy, first 02/20/2013  . Pyelonephritis    required hospitalization  . Pyelonephritis   . SVD (spontaneous vaginal delivery) 10/09/2015    Past Surgical History:  Procedure Laterality Date  . PERIPHERALLY INSERTED CENTRAL CATHETER INSERTION     for home meds. due to pyelo  . WISDOM TOOTH EXTRACTION      Family History  Problem Relation Age of Onset  . Diabetes Maternal Grandfather   . Breast cancer Maternal Grandmother     Social History  Substance Use Topics  . Smoking status: Never Smoker  . Smokeless tobacco: Never Used  . Alcohol use No    Allergies: No Known Allergies  Prescriptions Prior to Admission  Medication Sig Dispense Refill Last Dose  . etonogestrel (NEXPLANON) 68 MG IMPL implant 1  each by Subdermal route once.   Continuous  . ibuprofen (ADVIL,MOTRIN) 200 MG tablet Take 400 mg by mouth every 6 (six) hours as needed for headache.   Past Week at Unknown time  . miconazole (MICOTIN) 2 % cream Apply 1 application topically 2 (two) times daily.   04/19/2016 at Unknown time  . miconazole (MONISTAT 1 COMBO PACK) kit Place 1 each vaginally once.   04/19/2016 at Unknown time    Review of Systems  Constitutional: Negative for chills and fever.  Gastrointestinal: Negative for nausea and vomiting.  Genitourinary: Positive for dysuria and vaginal pain. Negative for urgency, vaginal bleeding and vaginal discharge.   Physical Exam   Blood pressure 125/67, pulse 86, temperature 98.4 F (36.9 C), temperature source Oral, resp. rate 20, last menstrual period 02/06/2016, unknown if currently breastfeeding.  Physical Exam  Nursing note and vitals reviewed. Constitutional: She is oriented to person, place, and time. She appears well-developed and well-nourished. No distress.  HENT:  Head: Normocephalic.  Cardiovascular: Normal rate.   Respiratory: Effort normal.  GI: Soft. There is no tenderness. There is no rebound.  Genitourinary:  Genitourinary Comments: External: large amount of thick white discharge seen.   Neurological: She is alert and oriented to person, place, and time.  Skin: Skin is warm and dry.  Psychiatric: She has a normal mood and affect.     Results for orders placed or performed during the hospital encounter of 04/19/16 (from the past 24 hour(s))  Urinalysis, Routine w reflex microscopic  Status: Abnormal   Collection Time: 04/19/16 10:57 PM  Result Value Ref Range   Color, Urine YELLOW YELLOW   APPearance CLOUDY (A) CLEAR   Specific Gravity, Urine 1.025 1.005 - 1.030   pH 6.0 5.0 - 8.0   Glucose, UA NEGATIVE NEGATIVE mg/dL   Hgb urine dipstick NEGATIVE NEGATIVE   Bilirubin Urine NEGATIVE NEGATIVE   Ketones, ur NEGATIVE NEGATIVE mg/dL   Protein, ur 30 (A)  NEGATIVE mg/dL   Nitrite NEGATIVE NEGATIVE   Leukocytes, UA LARGE (A) NEGATIVE   RBC / HPF TOO NUMEROUS TO COUNT 0 - 5 RBC/hpf   WBC, UA TOO NUMEROUS TO COUNT 0 - 5 WBC/hpf   Bacteria, UA RARE (A) NONE SEEN   Squamous Epithelial / LPF 6-30 (A) NONE SEEN   Mucous PRESENT   Wet prep, genital     Status: Abnormal   Collection Time: 04/20/16 12:12 AM  Result Value Ref Range   Yeast Wet Prep HPF POC PRESENT (A) NONE SEEN   Trich, Wet Prep NONE SEEN NONE SEEN   Clue Cells Wet Prep HPF POC NONE SEEN NONE SEEN   WBC, Wet Prep HPF POC MANY (A) NONE SEEN   Sperm NONE SEEN   Pregnancy, urine POC     Status: None   Collection Time: 04/20/16 12:14 AM  Result Value Ref Range   Preg Test, Ur NEGATIVE NEGATIVE     MAU Course  Procedures  MDM   Assessment and Plan   1. Yeast infection involving the vagina and surrounding area    DC home Comfort measures reviewed  RX: diflcuan as directed #2  Return to MAU as needed FU with OB as planned  Follow-up Information    Bovard-Stuckert, Jody, MD Follow up.   Specialty:  Obstetrics and Gynecology Contact information: [address] [place] [address] [phone]            Mathis Bud 04/20/2016, 12:03 AM

## 2016-04-21 LAB — GC/CHLAMYDIA PROBE AMP (~~LOC~~) NOT AT ARMC
CHLAMYDIA, DNA PROBE: NEGATIVE
Neisseria Gonorrhea: NEGATIVE

## 2016-05-17 ENCOUNTER — Ambulatory Visit (HOSPITAL_COMMUNITY)
Admission: EM | Admit: 2016-05-17 | Discharge: 2016-05-17 | Disposition: A | Discharge status: Home / Self Care | Payer: Medicaid Other | Attending: Internal Medicine | Admitting: Internal Medicine

## 2016-05-17 ENCOUNTER — Encounter (HOSPITAL_COMMUNITY): Payer: Self-pay | Admitting: Emergency Medicine

## 2016-05-17 DIAGNOSIS — S39012A Strain of muscle, fascia and tendon of lower back, initial encounter: Secondary | ICD-10-CM

## 2016-05-17 MED ORDER — NAPROXEN 500 MG PO TABS: 500.0000 mg | ORAL_TABLET | Freq: Two times a day (BID) | ORAL | 0 refills | Status: DC

## 2016-05-17 MED ORDER — CYCLOBENZAPRINE HCL 10 MG PO TABS: 10.0000 mg | ORAL_TABLET | Freq: Every day | ORAL | 0 refills | Status: DC

## 2016-05-17 NOTE — ED Triage Notes (Signed)
[facility] a [facility] mid back pain and leg soreness x 2 weeks. The patient reported a hx of back issues when younger.

## 2016-05-17 NOTE — ED Provider Notes (Signed)
CSN: [id number]     Arrival date & time 05/17/16  1856 History   None    Chief Complaint  Patient presents with  . Back Pain   (Consider location/radiation/quality/duration/timing/severity/associated sxs/prior Treatment) Patient c/o lower back pain x 2 weeks.   The history is provided by the patient.  Back Pain  Location:  Lumbar spine Quality:  Aching Pain severity:  Moderate Pain is:  Worse during the day Worsened by:  Nothing Ineffective treatments:  None tried   Past Medical History:  Diagnosis Date  . Bladder disorder    "small bladder" Sees MD at [facility]  . HA (headache)   . Hx of pyelonephritis   . Normal pregnancy in multigravida in third trimester 10/08/2015  . Normal pregnancy, first 02/20/2013  . Pyelonephritis    required hospitalization  . Pyelonephritis   . SVD (spontaneous vaginal delivery) 10/09/2015   Past Surgical History:  Procedure Laterality Date  . PERIPHERALLY INSERTED CENTRAL CATHETER INSERTION     for home meds. due to pyelo  . WISDOM TOOTH EXTRACTION     Family History  Problem Relation Age of Onset  . Diabetes Maternal Grandfather   . Breast cancer Maternal Grandmother    Social History  Substance Use Topics  . Smoking status: Never Smoker  . Smokeless tobacco: Never Used  . Alcohol use No   OB History    Gravida Para Term Preterm AB Living   2 2 2     2    SAB TAB Ectopic Multiple Live Births         0 2     Review of Systems  Constitutional: Negative.   HENT: Negative.   Respiratory: Negative.   Cardiovascular: Negative.   Gastrointestinal: Negative.   Genitourinary: Negative.   Musculoskeletal: Positive for back pain.  Neurological: Negative.     Allergies  Patient has no known allergies.  Home Medications   Prior to Admission medications   Medication Sig Start Date End Date Taking? Authorizing Provider  etonogestrel (NEXPLANON) 68 MG IMPL implant 1 each by Subdermal route once.   Yes Historical Provider, MD   cyclobenzaprine (FLEXERIL) 10 MG tablet Take 1 tablet (10 mg total) by mouth at bedtime. 05/17/16   [name], FNP  naproxen (NAPROSYN) 500 MG tablet Take 1 tablet (500 mg total) by mouth 2 (two) times daily with a meal. 05/17/16   [name], FNP   Meds Ordered and Administered this Visit  Medications - No data to display  BP 116/85 (BP Location: Right Arm)   Pulse 111   Temp 98.6 F (37 C) (Oral)   Resp 20   SpO2 99%   Breastfeeding? No  No data found.   Physical Exam  Constitutional: She appears well-developed and well-nourished.  HENT:  Head: Normocephalic and atraumatic.  Eyes: EOM are normal. Pupils are equal, round, and reactive to light.  Neck: Normal range of motion. Neck supple.  Cardiovascular: Normal rate, regular rhythm and normal heart sounds.   Pulmonary/Chest: Effort normal and breath sounds normal.  Musculoskeletal: She exhibits tenderness.  TTP left lumbar paraspinous muscles.  Negative SLR  Nursing note and vitals reviewed.   Urgent Care Course     Procedures (including critical care time)  Labs Review Labs Reviewed - No data to display  Imaging Review No results found.   Visual Acuity Review  Right Eye Distance:   Left Eye Distance:   Bilateral Distance:    Right Eye Near:   Left  Eye Near:    Bilateral Near:         MDM   1. Strain of lumbar region, initial encounter    Naprosyn 500mg  one po bid x 10 days Flexeril 10mg  one po qhs prn #10      Deatra CanterWilliam J Oxford, FNP 05/17/16 2118

## 2016-07-19 ENCOUNTER — Encounter (HOSPITAL_COMMUNITY): Payer: Self-pay | Admitting: Emergency Medicine

## 2016-07-19 ENCOUNTER — Ambulatory Visit (HOSPITAL_COMMUNITY)
Admission: EM | Admit: 2016-07-19 | Discharge: 2016-07-19 | Disposition: A | Discharge status: Home / Self Care | Payer: Medicaid Other | Attending: Internal Medicine | Admitting: Internal Medicine

## 2016-07-19 DIAGNOSIS — M545 Low back pain, unspecified: Secondary | ICD-10-CM

## 2016-07-19 DIAGNOSIS — M62830 Muscle spasm of back: Secondary | ICD-10-CM

## 2016-07-19 DIAGNOSIS — S39012A Strain of muscle, fascia and tendon of lower back, initial encounter: Secondary | ICD-10-CM | POA: Diagnosis not present

## 2016-07-19 LAB — POCT URINALYSIS DIP (DEVICE)
Bilirubin Urine: NEGATIVE
Glucose, UA: NEGATIVE mg/dL
HGB URINE DIPSTICK: NEGATIVE
Ketones, ur: NEGATIVE mg/dL
LEUKOCYTES UA: NEGATIVE
NITRITE: NEGATIVE
PH: 5.5 (ref 5.0–8.0)
PROTEIN: NEGATIVE mg/dL
Specific Gravity, Urine: >=1.03 (ref 1.005–1.030)
UROBILINOGEN UA: 0.2 mg/dL (ref 0.0–1.0)

## 2016-07-19 MED ORDER — NAPROXEN 375 MG PO TABS: 375.0000 mg | ORAL_TABLET | Freq: Two times a day (BID) | ORAL | 0 refills | Status: DC

## 2016-07-19 MED ORDER — METHOCARBAMOL 500 MG PO TABS: 500.0000 mg | ORAL_TABLET | Freq: Two times a day (BID) | ORAL | 0 refills | Status: DC

## 2016-07-19 NOTE — ED Triage Notes (Signed)
The patient presented to the [facility] with a complaint of lower back pain x 4 days that was similar to the last time she had a urinary/kidney infection. The patient denied any known injury to her back.

## 2016-07-19 NOTE — ED Provider Notes (Signed)
CSN: [id number]     Arrival date & time 07/19/16  1013 History   None    Chief Complaint  Patient presents with  . Back Pain   (Consider location/radiation/quality/duration/timing/severity/associated sxs/prior Treatment) 21 year old female complaining of left low back pain for 4 days. On rare occasion will radiate down the buttock. She states that she has had chronic intermittent back pain most of her life. She denies any sort of injury. Nothing seems to make it worse, nothing seems to make it better. She has applied heat, hot baths and high doses of Motrin without any relief. Denies urinary symptoms. Denies abdominal pain. Denies focal paresthesias or weakness.      Past Medical History:  Diagnosis Date  . Bladder disorder    "small bladder" Sees MD at [facility]  . HA (headache)   . Hx of pyelonephritis   . Normal pregnancy in multigravida in third trimester 10/08/2015  . Normal pregnancy, first 02/20/2013  . Pyelonephritis    required hospitalization  . Pyelonephritis   . SVD (spontaneous vaginal delivery) 10/09/2015   Past Surgical History:  Procedure Laterality Date  . PERIPHERALLY INSERTED CENTRAL CATHETER INSERTION     for home meds. due to pyelo  . WISDOM TOOTH EXTRACTION     Family History  Problem Relation Age of Onset  . Diabetes Maternal Grandfather   . Breast cancer Maternal Grandmother    Social History  Substance Use Topics  . Smoking status: Never Smoker  . Smokeless tobacco: Never Used  . Alcohol use No   OB History    Gravida Para Term Preterm AB Living   SAB TAB Ectopic Multiple Live Births         0 2     Review of Systems  Constitutional: Negative for activity change, chills and fever.  HENT: Negative.   Respiratory: Negative.   Cardiovascular: Negative.   Musculoskeletal: Positive for back pain.       As per HPI  Skin: Negative for color change, pallor and rash.  Neurological: Negative.     Allergies  Patient has no known  allergies.  Home Medications   Prior to Admission medications   Medication Sig Start Date End Date Taking? Authorizing Provider  etonogestrel (NEXPLANON) 68 MG IMPL implant 1 each by Subdermal route once.   Yes Historical Provider, MD  methocarbamol (ROBAXIN) 500 MG tablet Take 1 tablet (500 mg total) by mouth 2 (two) times daily. 07/19/16   Hayden Rasmussen, NP  naproxen (NAPROSYN) 375 MG tablet Take 1 tablet (375 mg total) by mouth 2 (two) times daily. 07/19/16   Hayden Rasmussen, NP   Meds Ordered and Administered this Visit  Medications - No data to display  BP 118/82 (BP Location: Left Arm)   Pulse 86   Temp 98.3 F (36.8 C) (Oral)   Resp 16   SpO2 100%  No data found.   Physical Exam  Constitutional: She is oriented to person, place, and time. She appears well-developed and well-nourished. No distress.  HENT:  Head: Normocephalic and atraumatic.  Eyes: EOM are normal.  Neck: Normal range of motion. Neck supple.  Cardiovascular: Normal rate.   Pulmonary/Chest: Effort normal.  Musculoskeletal: She exhibits no edema or deformity.  Palpation of left low paralumbar musculature reveals an area of lumpiness likely muscle/muscle spasm. This area is particularly tender. Palpation inferiorly along the para lumbar musculature has other tender areas. Having the patient leaned forward produces a "  pulling" sensation to the area and which was palpated. The bony tenderness, no spinal tenderness or deformity.  Neurological: She is alert and oriented to person, place, and time. No cranial nerve deficit.  Skin: Skin is warm and dry.  Nursing note and vitals reviewed.   Urgent Care Course     Procedures (including critical care time)  Labs Review Labs Reviewed  POCT URINALYSIS DIP (DEVICE)    Imaging Review No results found.   Visual Acuity Review  Right Eye Distance:   Left Eye Distance:   Bilateral Distance:    Right Eye Near:   Left Eye Near:    Bilateral Near:         MDM    1. Acute left-sided low back pain without sciatica   2. Strain of lumbar region, initial encounter   3. Muscle spasm of back    Continue applying heat at least 4 times a day. You may obtain those warm pad such as thermic care to apply over the muscle spasm. Perform the stretches as demonstrated and do this frequently at least 3-4 times a day. Avoid heavy lifting or bending over. Massage may help. Take medications as directed. Meds ordered this encounter  Medications  . methocarbamol (ROBAXIN) 500 MG tablet    Sig: Take 1 tablet (500 mg total) by mouth 2 (two) times daily.    Dispense:  20 tablet    Refill:  0    Order Specific Question:   Supervising Provider    Answer:   Eustace Moore [[id number]]  . naproxen (NAPROSYN) 375 MG tablet    Sig: Take 1 tablet (375 mg total) by mouth 2 (two) times daily.    Dispense:  20 tablet    Refill:  0    Order Specific Question:   Supervising Provider    Answer:   Eustace Moore [[id number]]       Hayden Rasmussen, NP 07/19/16 1123

## 2016-07-19 NOTE — Discharge Instructions (Signed)
Continue applying heat at least 4 times a day. You may obtain those warm pad such as thermic care to apply over the muscle spasm. Perform the stretches as demonstrated and do this frequently at least 3-4 times a day. Avoid heavy lifting or bending over. Massage may help. Take medications as directed.

## 2017-12-26 DIAGNOSIS — Z124 Encounter for screening for malignant neoplasm of cervix: Secondary | ICD-10-CM | POA: Diagnosis not present

## 2017-12-26 DIAGNOSIS — Z130 Encounter for screening for diseases of the blood and blood-forming organs and certain disorders involving the immune mechanism: Secondary | ICD-10-CM | POA: Diagnosis not present

## 2017-12-26 DIAGNOSIS — Z6841 Body Mass Index (BMI) 40.0 and over, adult: Secondary | ICD-10-CM | POA: Diagnosis not present

## 2017-12-26 DIAGNOSIS — Z01419 Encounter for gynecological examination (general) (routine) without abnormal findings: Secondary | ICD-10-CM | POA: Diagnosis not present

## 2018-01-20 DIAGNOSIS — Z23 Encounter for immunization: Secondary | ICD-10-CM | POA: Diagnosis not present

## 2018-01-20 DIAGNOSIS — Z0000 Encounter for general adult medical examination without abnormal findings: Secondary | ICD-10-CM | POA: Diagnosis not present

## 2018-02-02 DIAGNOSIS — Z136 Encounter for screening for cardiovascular disorders: Secondary | ICD-10-CM | POA: Diagnosis not present

## 2018-04-14 DIAGNOSIS — G43001 Migraine without aura, not intractable, with status migrainosus: Secondary | ICD-10-CM | POA: Diagnosis not present

## 2018-05-31 DIAGNOSIS — G43009 Migraine without aura, not intractable, without status migrainosus: Secondary | ICD-10-CM | POA: Diagnosis not present

## 2018-09-18 ENCOUNTER — Other Ambulatory Visit: Payer: Self-pay | Admitting: Family Medicine

## 2018-09-20 ENCOUNTER — Other Ambulatory Visit: Payer: Self-pay | Admitting: Family Medicine

## 2018-09-20 DIAGNOSIS — M79604 Pain in right leg: Secondary | ICD-10-CM

## 2018-10-02 ENCOUNTER — Ambulatory Visit
Admission: RE | Admit: 2018-10-02 | Discharge: 2018-10-02 | Disposition: A | Discharge status: Home / Self Care | Payer: 59 | Source: Ambulatory Visit | Attending: Family Medicine | Admitting: Family Medicine

## 2018-10-02 DIAGNOSIS — M79605 Pain in left leg: Secondary | ICD-10-CM

## 2018-10-02 DIAGNOSIS — M79604 Pain in right leg: Secondary | ICD-10-CM

## 2021-10-07 ENCOUNTER — Ambulatory Visit (INDEPENDENT_AMBULATORY_CARE_PROVIDER_SITE_OTHER): Payer: 59 | Admitting: Allergy

## 2021-10-07 ENCOUNTER — Encounter: Payer: Self-pay | Admitting: Allergy

## 2021-10-07 VITALS — BP 100/64 | HR 77 | Temp 98.2°F | Ht 62.0 in | Wt 211.4 lb

## 2021-10-07 DIAGNOSIS — J310 Chronic rhinitis: Secondary | ICD-10-CM

## 2021-10-07 DIAGNOSIS — T781XXD Other adverse food reactions, not elsewhere classified, subsequent encounter: Secondary | ICD-10-CM

## 2021-10-07 MED ORDER — RYALTRIS 665-25 MCG/ACT NA SUSP: NASAL | 5 refills | Status: DC

## 2021-10-07 MED ORDER — LEVOCETIRIZINE DIHYDROCHLORIDE 5 MG PO TABS: 5.0000 mg | ORAL_TABLET | Freq: Every evening | ORAL | 2 refills | Status: DC

## 2021-10-07 MED ORDER — MONTELUKAST SODIUM 10 MG PO TABS: 10.0000 mg | ORAL_TABLET | Freq: Every day | ORAL | 1 refills | Status: DC

## 2021-10-07 NOTE — Progress Notes (Signed)
New Patient Note  RE: Leslie Gill MRN: [id number] DOB: September 21, 1995 Date of Office Visit: 10/07/2021  Primary care provider: Moshe Cipro, NP  Chief Complaint: allergies  History of present illness: Leslie Gill is a 26 y.o. female presenting today for evaluation of allergic rhinitis.   She states she has been getting a lot of sinus infections.  She states as soon as she would get over one she would get another one.  She reports sinus infection symptoms including "really bad headache" that is described as "throbbing pain all over", a lot of sinus drainage, sore throat, nasal congestion.  Sometimes she will have ear pressure.  She states symptoms have been better the past couple weeks.  When it was worse she states it was ongoing past 4-5 months.  It has been worse this year.  She gets these sinus infections usually will let is run its course.  She states cough/cold medication, allergy medications don't seem to help.  She has tried taking daily allergy medication but states she continued to get these sinus symptoms (zyrtec and allegra).  Allegra worked better than zyrtec she felt.  She has tried flonase which did not seem to help. She has performed sinus rinse before but states within 10 minutes she was back to being congested.  She has not really gone to PCP or urgent care for the symptoms.  She has not had any antibiotics or systemic steroids for the symptoms.  No history of asthma, eczema or food allergy.  Within the past year lemons has caused her to break out and get itchy and may cause throat itching.  Juice from oranges as well as lines also causes similar symptoms but states [place] it is worse.  Review of systems: Review of Systems  Constitutional: Negative.   HENT:         See HPI  Eyes: Negative.   Respiratory: Negative.    Cardiovascular: Negative.   Gastrointestinal: Negative.   Musculoskeletal: Negative.   Skin: Negative.   Allergic/Immunologic: Negative.    Neurological: Negative.     All other systems negative unless noted above in HPI  Past medical history: Past Medical History:  Diagnosis Date   Bladder disorder    "small bladder" Sees MD at [facility]   HA (headache)    Hx of pyelonephritis    Normal pregnancy in multigravida in third trimester 10/08/2015   Normal pregnancy, first 02/20/2013   Pyelonephritis    required hospitalization   Pyelonephritis    SVD (spontaneous vaginal delivery) 10/09/2015    Past surgical history: Past Surgical History:  Procedure Laterality Date   PERIPHERALLY INSERTED CENTRAL CATHETER INSERTION     for home meds. due to pyelo   WISDOM TOOTH EXTRACTION      Family history:  Family History  Problem Relation Age of Onset   Diabetes Maternal Grandfather    Breast cancer Maternal Grandmother     Social history: Lives in a home with carpeting in the bedroom with electric heating and central cooling.  No concern for water damage, mildew or roaches in the home.  1 rabbit in the home.  Dog outside the home.  She is a Clinical biochemist.  She denies a smoking history.   Medication List: Current Outpatient Medications  Medication Sig Dispense Refill   levocetirizine (XYZAL) 5 MG tablet Take 1 tablet (5 mg total) by mouth every evening. 30 tablet 2   montelukast (SINGULAIR) 10 MG tablet Take 1 tablet (10 mg total)  by mouth at bedtime. 90 tablet 1   Olopatadine-Mometasone (RYALTRIS) 665-25 MCG/ACT SUSP Place 2 sprays per nostril 1-2 times daily as needed for nasal drainage or congestion 29 g 5   oxybutynin (DITROPAN-XL) 10 MG 24 hr tablet Take 1 tablet every day by oral route.     zonisamide (ZONEGRAN) 100 MG capsule Take 200 mg by mouth 2 (two) times daily.     No current facility-administered medications for this visit.    Known medication allergies: No Known Allergies   Physical examination: Blood pressure 100/64, pulse 77, temperature 98.2 F (36.8 C), height 5\' 2"  (1.575 m), weight 211 lb  6.4 oz (95.9 kg), SpO2 99 %.  General: Alert, interactive, in no acute distress. HEENT: PERRLA, TMs pearly gray, turbinates non-edematous without discharge, post-pharynx non erythematous. Neck: Supple without lymphadenopathy. Lungs: Clear to auscultation without wheezing, rhonchi or rales. {no increased work of breathing. CV: Normal S1, S2 without murmurs. Abdomen: Nondistended, nontender. Skin: Warm and dry, without lesions or rashes. Extremities:  No clubbing, cyanosis or edema. Neuro:   Grossly intact.  Diagnositics/Labs:  Allergy testing:   Airborne Adult Perc - 10/07/21 1000     Time Antigen Placed 1059    Allergen Manufacturer 10/09/21    Location Back    Number of Test 59    Comments non-reactive             Allergy testing results were read and interpreted by provider, documented by clinical staff.   Assessment and plan: Rhinitis, chronic  - Testing today showed: non-reactive.  Will obtain environmental allergy panel via labwork - Recommend you try the following allergy medication regimen: Xyzal (levocetirizine) 5mg  tablet once daily.  This is a long-acting antihistamine you have not tried thus far.  It may be more effective than Zyrtec and Allegra.   Singulair (montelukast) 10mg  daily.  This is an antileukotriene that can work with antihistamine for more symptom control.  This is a prescription only medications.  If you notice any change in mood/behavior/sleep after starting Singulair then stop this medication and let Waynette Buttery know.  Symptoms resolve after stopping the medication.   Ryaltris (olopatadine/mometasone) two sprays per nostril 1-2 times daily as needed for nasal drainage or congestion.  Sample provided.  - You can use an extra dose of the antihistamine, if needed, for breakthrough symptoms.  - Consider nasal saline rinses 1-2 times daily to remove allergens from the nasal cavities as well as help with mucous clearance (this is especially helpful to do before the  nasal sprays are given) - Continue to monitor frequency of symptoms  Adverse food reaction - Will obtain serum IgE levels for lemon, lime and orange to see if you have food allergy or if this could be a form of oral allergy syndrome. - Would avoid for now for prevent symptoms.   If testing is positive then would recommend you have access to epinephrine device.    Follow-up in 3 months or sooner if needed  I appreciate the opportunity to take part in Kadiatou's care. Please do not hesitate to contact me with questions.  Sincerely,   , MD Allergy/Immunology [facility]

## 2021-10-07 NOTE — Patient Instructions (Addendum)
-   Testing today showed: non-reactive.  Will obtain environmental allergy panel via labwork - Recommend you try the following allergy medication regimen: Xyzal (levocetirizine) 5mg  tablet once daily.  This is a long-acting antihistamine you have not tried thus far.  It may be more effective than Zyrtec and Allegra.   Singulair (montelukast) 10mg  daily.  This is an antileukotriene that can work with antihistamine for more symptom control.  This is a prescription only medications.  If you notice any change in mood/behavior/sleep after starting Singulair then stop this medication and let know.  Symptoms resolve after stopping the medication.   Ryaltris (olopatadine/mometasone) two sprays per nostril 1-2 times daily as needed for nasal drainage or congestion.  Sample provided.  - You can use an extra dose of the antihistamine, if needed, for breakthrough symptoms.  - Consider nasal saline rinses 1-2 times daily to remove allergens from the nasal cavities as well as help with mucous clearance (this is especially helpful to do before the nasal sprays are given) - Continue to monitor frequency of symptoms  - Will obtain serum IgE levels for lemon, lime and orange to see if you have food allergy or if this could be a form of oral allergy syndrome (see below).  - Would avoid for now for prevent symptoms.   If testing is positive then would recommend you have access to epinephrine device.    Follow-up in 3 months or sooner if needed   The oral allergy syndrome (OAS) or pollen-food allergy syndrome (PFAS) is a relatively common form of food allergy, particularly in adults. It typically occurs in people who have pollen allergies when the immune system "sees" proteins on the food that look like proteins on the pollen. This results in the allergy antibody (IgE) binding to the food instead of the pollen. Patients typically report itching and/or mild swelling of the mouth and throat immediately following ingestion  of certain uncooked fruits (including nuts) or raw vegetables. Only a very small number of affected individuals experience systemic allergic reactions, such as anaphylaxis which occurs with true food allergies.       **We are ordering labs, so please allow 1-2 weeks for the results to come back.  With the newly implemented Cures Act, the labs might be visible to you at the same time that they become visible to me.  However, I will not address the results until all of the results come  back, so please be patient.  In the meantime, continue avoiding your triggering food(s) in your After Visit Summary, including avoidance measures (if applicable), until you hear from me about the results.

## 2021-10-10 LAB — F306-IGE LIME: Allergen Lime IgE: <0.1 kU/L

## 2021-10-10 LAB — ALLERGENS W/TOTAL IGE AREA 2
Alternaria Alternata IgE: <0.1 kU/L
Aspergillus Fumigatus IgE: <0.1 kU/L
Bermuda Grass IgE: <0.1 kU/L
Cat Dander IgE: <0.1 kU/L
Cedar, Mountain IgE: <0.1 kU/L
Cladosporium Herbarum IgE: <0.1 kU/L
Cockroach, German IgE: <0.1 kU/L
Common Silver Birch IgE: <0.1 kU/L
Cottonwood IgE: <0.1 kU/L
D Farinae IgE: <0.1 kU/L
D Pteronyssinus IgE: <0.1 kU/L
Dog Dander IgE: <0.1 kU/L
Elm, American IgE: <0.1 kU/L
IgE (Immunoglobulin E), Serum: 3 IU/mL — ABNORMAL LOW (ref 6–495)
Johnson Grass IgE: <0.1 kU/L
Maple/Box Elder IgE: <0.1 kU/L
Mouse Urine IgE: <0.1 kU/L
Oak, White IgE: <0.1 kU/L
Pecan, Hickory IgE: <0.1 kU/L
Penicillium Chrysogen IgE: <0.1 kU/L
Pigweed, Rough IgE: <0.1 kU/L
Ragweed, Short IgE: <0.1 kU/L
Sheep Sorrel IgE Qn: <0.1 kU/L
Timothy Grass IgE: <0.1 kU/L
White Mulberry IgE: <0.1 kU/L

## 2021-10-10 LAB — ALLERGEN, ORANGE F33: Orange: <0.1 kU/L

## 2021-10-10 LAB — ALLERGEN, LEMON, F208: Lemon: <0.1 kU/L

## 2021-10-16 ENCOUNTER — Other Ambulatory Visit: Payer: Self-pay

## 2021-10-16 MED ORDER — LEVOCETIRIZINE DIHYDROCHLORIDE 5 MG PO TABS: 5.0000 mg | ORAL_TABLET | Freq: Every evening | ORAL | 2 refills | Status: DC

## 2021-10-16 NOTE — Progress Notes (Signed)
Received notification from [facility] regarding prescription for Levocetirizine (Xyzal) 5 mg 1 tablet by mouth 1 time per day.  Pharmacy requesting clarification: Rx for Allegra-D electronically sent on 08/27/21 from another provider - will patient be using both? Advised per provider office notes, patient will be using prescription for Levocetirizine  5 mg and Montelukast 10 mg.  Per pharmacy request - electronically sending in Rx Levocetirizine (Xyzal) 5 mg 1 (One) tablet by mouth 1 (One) time per day RF: 2

## 2022-01-15 ENCOUNTER — Ambulatory Visit: Payer: 59 | Admitting: Allergy

## 2022-03-03 LAB — HEPATITIS C ANTIBODY: HCV Ab: NEGATIVE

## 2022-03-16 LAB — OB RESULTS CONSOLE HIV ANTIBODY (ROUTINE TESTING): HIV: NONREACTIVE

## 2022-03-16 LAB — OB RESULTS CONSOLE VARICELLA ZOSTER ANTIBODY, IGG: Varicella: IMMUNE

## 2022-03-16 LAB — OB RESULTS CONSOLE RUBELLA ANTIBODY, IGM: Rubella: IMMUNE

## 2022-03-16 LAB — OB RESULTS CONSOLE HEPATITIS B SURFACE ANTIGEN: Hepatitis B Surface Ag: NEGATIVE

## 2022-06-15 LAB — OB RESULTS CONSOLE GC/CHLAMYDIA
Chlamydia: NEGATIVE
Neisseria Gonorrhea: NEGATIVE

## 2022-06-15 LAB — OB RESULTS CONSOLE RPR: RPR: NONREACTIVE

## 2022-08-13 LAB — OB RESULTS CONSOLE GBS: GBS: NEGATIVE

## 2022-08-18 ENCOUNTER — Encounter (HOSPITAL_COMMUNITY): Payer: Self-pay | Admitting: *Deleted

## 2022-08-18 ENCOUNTER — Other Ambulatory Visit: Payer: Self-pay

## 2022-08-18 ENCOUNTER — Inpatient Hospital Stay (HOSPITAL_COMMUNITY)
Admission: AD | Admit: 2022-08-18 | Discharge: 2022-08-18 | Disposition: A | Discharge status: Home / Self Care | Payer: BC Managed Care – PPO | Attending: Obstetrics and Gynecology | Admitting: Obstetrics and Gynecology

## 2022-08-18 DIAGNOSIS — Z0371 Encounter for suspected problem with amniotic cavity and membrane ruled out: Secondary | ICD-10-CM | POA: Diagnosis present

## 2022-08-18 DIAGNOSIS — Z3A36 36 weeks gestation of pregnancy: Secondary | ICD-10-CM | POA: Diagnosis not present

## 2022-08-18 LAB — POCT FERN TEST: POCT Fern Test: NEGATIVE

## 2022-08-18 LAB — AMNISURE RUPTURE OF MEMBRANE (ROM) NOT AT ARMC: Amnisure ROM: NEGATIVE

## 2022-08-18 NOTE — MAU Provider Note (Signed)
S: Ms. Leslie Gill is a 27 y.o. G3P2002 at [redacted]w[redacted]d  who presents to MAU today complaining of leaking of fluid since 0900 today. She denies vaginal bleeding. She denies contractions. She reports normal fetal movement. She states that she had her cervix checked in the office yesterday and she was 2cm at that time.   O: BP 117/68 (BP Location: Right Arm)   Pulse 96   Temp 98 F (36.7 C) (Oral)   Resp 19   SpO2 97%  GENERAL: Well-developed, well-nourished female in no acute distress.  HEAD: Normocephalic, atraumatic.  CHEST: Normal effort of breathing, regular heart rate ABDOMEN: Soft, nontender, gravid PELVIC: Normal external female genitalia. Vagina is pink and rugated. Cervix with normal contour, no lesions. Normal discharge.   Cervical exam:  Dilation: 2 Effacement (%): 50 Station: -3 Presentation: Vertex Exam by:: H Willistine Ferrall CNM   Fetal Monitoring: Baseline: 140 Variability: moderate Accelerations: 15x15 Decelerations: none Contractions: q5-10 mins Reactive NST   Results for orders placed or performed during the hospital encounter of 08/18/22 (from the past 24 hour(s))  POCT fern test     Status: None   Collection Time: 08/18/22  5:46 PM  Result Value Ref Range   POCT Fern Test Negative = intact amniotic membranes   Amnisure rupture of membrane (rom)not at [facility]     Status: None   Collection Time: 08/18/22  6:33 PM  Result Value Ref Range   Amnisure ROM NEGATIVE      A: 1. Encounter for suspected premature rupture of membranes, with rupture of membranes not found   2. [redacted] weeks gestation of pregnancy      P:   DC home in stable condition  3rd Trimester precautions  PTL precautions  Fetal kick counts RX: none  Return to MAU as needed FU with OB as planned   Follow-up Information     Ob/Gyn, [facility] Follow up.   Contact information: [address] [place] [address] [phone]                 Thressa Sheller DNP, CNM   08/18/22  7:10 PM

## 2022-08-18 NOTE — MAU Note (Signed)
Leslie Gill is a 27 y.o. at [redacted]w[redacted]d here in MAU reporting: she began leaking fluid this morning since 0900, states fluid is clear..  Denies VB.  Reports fetus is moving but less than usual. LMP: NA Onset of complaint: today Pain score: 2 Vitals:   08/18/22 1715  BP: 118/71  Pulse: (!) 103  Resp: 18  Temp: 98 F (36.7 C)  SpO2: 97%     FHT:155 Lab orders placed from triage:   None

## 2022-09-07 ENCOUNTER — Other Ambulatory Visit: Payer: Self-pay

## 2022-09-07 ENCOUNTER — Inpatient Hospital Stay (HOSPITAL_COMMUNITY): Payer: BC Managed Care – PPO | Admitting: Anesthesiology

## 2022-09-07 ENCOUNTER — Inpatient Hospital Stay (HOSPITAL_COMMUNITY)
Admission: RE | Admit: 2022-09-07 | Discharge: 2022-09-08 | DRG: 807 | Disposition: A | Discharge status: Home / Self Care | Payer: BC Managed Care – PPO | Attending: Obstetrics and Gynecology | Admitting: Obstetrics and Gynecology

## 2022-09-07 ENCOUNTER — Encounter (HOSPITAL_COMMUNITY): Payer: Self-pay | Admitting: Obstetrics and Gynecology

## 2022-09-07 DIAGNOSIS — Z3A39 39 weeks gestation of pregnancy: Secondary | ICD-10-CM | POA: Diagnosis not present

## 2022-09-07 DIAGNOSIS — O99214 Obesity complicating childbirth: Principal | ICD-10-CM | POA: Diagnosis present

## 2022-09-07 DIAGNOSIS — O26893 Other specified pregnancy related conditions, third trimester: Secondary | ICD-10-CM | POA: Diagnosis present

## 2022-09-07 DIAGNOSIS — Z3490 Encounter for supervision of normal pregnancy, unspecified, unspecified trimester: Secondary | ICD-10-CM | POA: Diagnosis present

## 2022-09-07 LAB — CBC
HCT: 31.6 % — ABNORMAL LOW (ref 36.0–46.0)
Hemoglobin: 11 g/dL — ABNORMAL LOW (ref 12.0–15.0)
MCH: 29.9 pg (ref 26.0–34.0)
MCHC: 34.8 g/dL (ref 30.0–36.0)
MCV: 85.9 fL (ref 80.0–100.0)
Platelets: 223 10*3/uL (ref 150–400)
RBC: 3.68 MIL/uL — ABNORMAL LOW (ref 3.87–5.11)
RDW: 13.2 % (ref 11.5–15.5)
WBC: 9.4 10*3/uL (ref 4.0–10.5)
nRBC: 0 % (ref 0.0–0.2)

## 2022-09-07 LAB — TYPE AND SCREEN
ABO/RH(D): A POS
Antibody Screen: NEGATIVE

## 2022-09-07 LAB — RPR: RPR Ser Ql: NONREACTIVE

## 2022-09-07 MED ORDER — WITCH HAZEL-GLYCERIN EX PADS: 1.0000 | MEDICATED_PAD | CUTANEOUS | Status: DC | PRN

## 2022-09-07 MED ORDER — SIMETHICONE 80 MG PO CHEW: 80.0000 mg | CHEWABLE_TABLET | ORAL | Status: DC | PRN

## 2022-09-07 MED ORDER — TETANUS-DIPHTH-ACELL PERTUSSIS 5-2.5-18.5 LF-MCG/0.5 IM SUSY: 0.5000 mL | PREFILLED_SYRINGE | Freq: Once | INTRAMUSCULAR | Status: DC

## 2022-09-07 MED ORDER — LIDOCAINE HCL (PF) 1 % IJ SOLN
INTRAMUSCULAR | Status: DC | PRN
  Administered 2022-09-07: 5 mL via EPIDURAL

## 2022-09-07 MED ORDER — PRENATAL MULTIVITAMIN CH
1.0000 | ORAL_TABLET | Freq: Every day | ORAL | Status: DC
  Administered 2022-09-08: 1 via ORAL
  Filled 2022-09-07: qty 1

## 2022-09-07 MED ORDER — OXYTOCIN-SODIUM CHLORIDE 30-0.9 UT/500ML-% IV SOLN
2.5000 [IU]/h | INTRAVENOUS | Status: DC
  Administered 2022-09-07: 2.5 [IU]/h via INTRAVENOUS
  Filled 2022-09-07: qty 500

## 2022-09-07 MED ORDER — ZOLPIDEM TARTRATE 5 MG PO TABS: 5.0000 mg | ORAL_TABLET | Freq: Every evening | ORAL | Status: DC | PRN

## 2022-09-07 MED ORDER — ACETAMINOPHEN 325 MG PO TABS: 650.0000 mg | ORAL_TABLET | ORAL | Status: DC | PRN

## 2022-09-07 MED ORDER — PHENYLEPHRINE 80 MCG/ML (10ML) SYRINGE FOR IV PUSH (FOR BLOOD PRESSURE SUPPORT): 80.0000 ug | PREFILLED_SYRINGE | INTRAVENOUS | Status: DC | PRN

## 2022-09-07 MED ORDER — TERBUTALINE SULFATE 1 MG/ML IJ SOLN: 0.2500 mg | Freq: Once | INTRAMUSCULAR | Status: DC | PRN

## 2022-09-07 MED ORDER — LACTATED RINGERS IV SOLN
500.0000 mL | Freq: Once | INTRAVENOUS | Status: AC
  Administered 2022-09-07: 500 mL via INTRAVENOUS

## 2022-09-07 MED ORDER — DIBUCAINE (PERIANAL) 1 % EX OINT: 1.0000 | TOPICAL_OINTMENT | CUTANEOUS | Status: DC | PRN

## 2022-09-07 MED ORDER — ONDANSETRON HCL 4 MG/2ML IJ SOLN
4.0000 mg | Freq: Four times a day (QID) | INTRAMUSCULAR | Status: DC | PRN
  Administered 2022-09-07: 4 mg via INTRAVENOUS
  Filled 2022-09-07: qty 2

## 2022-09-07 MED ORDER — LIDOCAINE-EPINEPHRINE (PF) 1.5 %-1:200000 IJ SOLN
INTRAMUSCULAR | Status: DC | PRN
  Administered 2022-09-07: 5 mL via EPIDURAL

## 2022-09-07 MED ORDER — FENTANYL-BUPIVACAINE-NACL 0.5-0.125-0.9 MG/250ML-% EP SOLN
12.0000 mL/h | EPIDURAL | Status: DC | PRN
  Administered 2022-09-07: 12 mL/h via EPIDURAL
  Filled 2022-09-07: qty 250

## 2022-09-07 MED ORDER — SENNOSIDES-DOCUSATE SODIUM 8.6-50 MG PO TABS
2.0000 | ORAL_TABLET | Freq: Every day | ORAL | Status: DC
  Administered 2022-09-08: 2 via ORAL
  Filled 2022-09-07: qty 2

## 2022-09-07 MED ORDER — ONDANSETRON HCL 4 MG/2ML IJ SOLN: 4.0000 mg | INTRAMUSCULAR | Status: DC | PRN

## 2022-09-07 MED ORDER — LACTATED RINGERS IV SOLN: INTRAVENOUS | Status: DC

## 2022-09-07 MED ORDER — LACTATED RINGERS IV SOLN
500.0000 mL | INTRAVENOUS | Status: DC | PRN
  Administered 2022-09-07: 500 mL via INTRAVENOUS

## 2022-09-07 MED ORDER — DIPHENHYDRAMINE HCL 25 MG PO CAPS: 25.0000 mg | ORAL_CAPSULE | Freq: Four times a day (QID) | ORAL | Status: DC | PRN

## 2022-09-07 MED ORDER — BENZOCAINE-MENTHOL 20-0.5 % EX AERO
1.0000 | INHALATION_SPRAY | CUTANEOUS | Status: DC | PRN
  Administered 2022-09-07: 1 via TOPICAL
  Filled 2022-09-07: qty 56

## 2022-09-07 MED ORDER — ONDANSETRON HCL 4 MG PO TABS: 4.0000 mg | ORAL_TABLET | ORAL | Status: DC | PRN

## 2022-09-07 MED ORDER — IBUPROFEN 600 MG PO TABS
600.0000 mg | ORAL_TABLET | Freq: Four times a day (QID) | ORAL | Status: DC
  Administered 2022-09-07 – 2022-09-08 (×4): 600 mg via ORAL
  Filled 2022-09-07 (×4): qty 1

## 2022-09-07 MED ORDER — DIPHENHYDRAMINE HCL 50 MG/ML IJ SOLN: 12.5000 mg | INTRAMUSCULAR | Status: DC | PRN

## 2022-09-07 MED ORDER — SOD CITRATE-CITRIC ACID 500-334 MG/5ML PO SOLN: 30.0000 mL | ORAL | Status: DC | PRN

## 2022-09-07 MED ORDER — EPHEDRINE 5 MG/ML INJ: 10.0000 mg | INTRAVENOUS | Status: DC | PRN

## 2022-09-07 MED ORDER — OXYCODONE-ACETAMINOPHEN 5-325 MG PO TABS: 2.0000 | ORAL_TABLET | ORAL | Status: DC | PRN

## 2022-09-07 MED ORDER — LIDOCAINE HCL (PF) 1 % IJ SOLN: 30.0000 mL | INTRAMUSCULAR | Status: DC | PRN

## 2022-09-07 MED ORDER — OXYCODONE-ACETAMINOPHEN 5-325 MG PO TABS: 1.0000 | ORAL_TABLET | ORAL | Status: DC | PRN

## 2022-09-07 MED ORDER — COCONUT OIL OIL: 1.0000 | TOPICAL_OIL | Status: DC | PRN

## 2022-09-07 MED ORDER — OXYTOCIN BOLUS FROM INFUSION
333.0000 mL | Freq: Once | INTRAVENOUS | Status: AC
  Administered 2022-09-07: 333 mL via INTRAVENOUS

## 2022-09-07 MED ORDER — OXYTOCIN-SODIUM CHLORIDE 30-0.9 UT/500ML-% IV SOLN
1.0000 m[IU]/min | INTRAVENOUS | Status: DC
  Administered 2022-09-07: 2 m[IU]/min via INTRAVENOUS

## 2022-09-07 NOTE — H&P (Signed)
Leslie Gill is a 27 y.o. female presenting for scheduled iOL at term.  PNC c/b migraines formerly on zonisamide (stopped with preg) now on fioricet. Conceived immediately prior to Nexplanon which was removed at 12wks  GBS neg OB History     Gravida  3   Para  2   Term  2   Preterm      AB      Living  2      SAB      IAB      Ectopic      Multiple  0   Live Births  2          Past Medical History:  Diagnosis Date   Bladder disorder    "small bladder" Sees MD at [facility]   HA (headache)    Hx of pyelonephritis    Normal pregnancy in multigravida in third trimester 10/08/2015   Normal pregnancy, first 02/20/2013   Pyelonephritis    required hospitalization   Pyelonephritis    SVD (spontaneous vaginal delivery) 10/09/2015   Past Surgical History:  Procedure Laterality Date   PERIPHERALLY INSERTED CENTRAL CATHETER INSERTION     for home meds. due to pyelo   WISDOM TOOTH EXTRACTION     Family History: family history includes Breast cancer in her maternal grandmother; Diabetes in her maternal grandfather; Healthy in her mother. Social History:  reports that she has never smoked. She has never used smokeless tobacco. She reports that she does not drink alcohol and does not use drugs.     Maternal Diabetes: No1hr 120 Genetic Screening: Normal Maternal Ultrasounds/Referrals: Normal Fetal Ultrasounds or other Referrals:  None Maternal Substance Abuse:  No Significant Maternal Medications:  None Significant Maternal Lab Results:  Group B Strep negative Number of Prenatal Visits:greater than 3 verified prenatal visits Other Comments:  None  Review of Systems  Constitutional:  Negative for chills and fever.  Respiratory:  Negative for shortness of breath.   Cardiovascular:  Negative for chest pain, palpitations and leg swelling.  Gastrointestinal:  Negative for abdominal pain and vomiting.  Neurological:  Negative for dizziness, weakness and headaches.   Psychiatric/Behavioral:  Negative for suicidal ideas.    Maternal Medical History:  Reason for admission: Contractions.   Contractions: Onset was less than 1 hour ago.   Frequency: regular.   Perceived severity is moderate.   Fetal activity: Perceived fetal activity is normal.     Dilation: 4 Effacement (%): 70 Station: -2 Exam by:: Sallyanne Kuster, RN Blood pressure 112/66, pulse 82, temperature 98.3 F (36.8 C), temperature source Oral, resp. rate 16, height 5\' 2"  (1.575 m), weight 106.4 kg, SpO2 96 %. Exam Physical Exam Constitutional:      General: She is not in acute distress.    Appearance: She is well-developed.  HENT:     Head: Normocephalic and atraumatic.  Eyes:     Pupils: Pupils are equal, round, and reactive to light.  Cardiovascular:     Rate and Rhythm: Normal rate and regular rhythm.     Heart sounds: No murmur heard.    No gallop.  Abdominal:     Tenderness: There is no abdominal tenderness. There is no guarding or rebound.  Genitourinary:    Vagina: Normal.  Musculoskeletal:        General: Normal range of motion.     Cervical back: Normal range of motion and neck supple.  Skin:    General: Skin is warm and dry.  Neurological:  Mental Status: She is alert and oriented to person, place, and time.     Prenatal labs: ABO, Rh: --/--/A POS (05/28 0221) Antibody: NEG (05/28 0221) Rubella: Immune (12/05 0000) RPR: Nonreactive (03/05 0000)  HBsAg: Negative (12/05 0000)  HIV: Non-reactive (12/05 0000)  GBS: Negative/-- (05/03 0000)   Cat 1 tracing baseline 130, + accels, no decels, mod var TOCO q3-29m, pitocin at 42mU/min  Assessment/Plan: This is a 27yo G3P2002 @ 39 1/7 by 1st trim scan admitted for IOL at erm, GBS neg. Pelvis proven to 7lb15oz. Epidural when desired, AROM once comfortable, anticipate SVD   Valerie Roys Roberts Bon 09/07/2022, 6:48 AM

## 2022-09-07 NOTE — Progress Notes (Signed)
Patient is comfortable s/p epidural  BP 114/64   Pulse 76   Temp 97.9 F (36.6 C) (Oral)   Resp 16   Ht 5\' 2"  (1.575 m)   Wt 106.4 kg   SpO2 97%   BMI 42.89 kg/m  Clear AROM 0950, CE 5/90/-2 Cat 1 tracing TOCO q2-39min, pitocin at 12 mU/min  Anticipate SVD, continue to titrate pitocin per protocol

## 2022-09-07 NOTE — Lactation Note (Signed)
This note was copied from a baby's chart. Lactation Consultation Note  Patient Name: Leslie Gill [id number] Date: 09/07/2022 Age:27 hours  Reason for consult: Initial assessment;Term;Breastfeeding assistance  P3, GA [redacted]w[redacted]d, 9 lbs1 oz  Upon entry to room, NP was assessing baby. Infant showing feeding cues and mother receptive to assistance with latch after infant's exam. Mother states baby was breastfeeding earlier and while he was latched to the left breast, her right breast was leaking.   Basic breastfeeding education and pillows aligned to breast for deeper latch in football hold. Mother's nipples are short shafted and areola tissue is compressible. Mother was able to easily express colostrum from the right breast prior to latch. Baby latched well and rhythmically sucking with bursts of swallows, especially with breast compression.   Encouraged to feed baby with feeding cues, call for assistance as needed, and skin to skin while mother is alert and wake, if baby does not latch. Mother reports her first baby did not latch and her second fed for a few weeks and "weaned himself".   Handout provided with information regarding O/P services, breastfeeding support groups, and our phone # for post-discharge questions.     Maternal Data Has patient been taught Hand Expression?: Yes Does the patient have breastfeeding experience prior to this delivery?: Yes How long did the patient breastfeed?: few weeks with 2nd child  Feeding Mother's Current Feeding Choice: Breast Milk and Formula  LATCH Score Latch: Grasps breast easily, tongue down, lips flanged, rhythmical sucking.  Audible Swallowing: A few with stimulation  Type of Nipple: Flat (soft compressible areola tissue)  Comfort (Breast/Nipple): Soft / non-tender  Hold (Positioning): Assistance needed to correctly position infant at breast and maintain latch.  LATCH Score: 7   Interventions Interventions: Breast feeding basics  reviewed;Skin to skin;Assisted with latch;Hand express;Breast compression;Support pillows;Adjust position;Education;LC Services brochure   Consult Status Consult Status: Follow-up Date: 09/08/22 Follow-up type: In-patient    Christella Hartigan M 09/07/2022, 6:58 PM

## 2022-09-07 NOTE — Anesthesia Preprocedure Evaluation (Signed)
Anesthesia Evaluation  Patient identified by MRN, date of birth, ID band Patient awake    Reviewed: Allergy & Precautions, H&P , NPO status , Patient's Chart, lab work & pertinent test results  History of Anesthesia Complications Negative for: history of anesthetic complications  Airway Mallampati: II  TM Distance: >3 FB Neck ROM: full    Dental no notable dental hx. (+) Teeth Intact   Pulmonary neg pulmonary ROS   Pulmonary exam normal breath sounds clear to auscultation       Cardiovascular negative cardio ROS Normal cardiovascular exam Rhythm:regular Rate:Normal     Neuro/Psych  Headaches  negative psych ROS   GI/Hepatic negative GI ROS, Neg liver ROS,,,  Endo/Other    Morbid obesity  Renal/GU negative Renal ROS  negative genitourinary   Musculoskeletal   Abdominal  (+) + obese  Peds  Hematology  (+) Blood dyscrasia, anemia   Anesthesia Other Findings   Reproductive/Obstetrics (+) Pregnancy                             Anesthesia Physical Anesthesia Plan  ASA: 3  Anesthesia Plan: Epidural   Post-op Pain Management:    Induction:   PONV Risk Score and Plan:   Airway Management Planned:   Additional Equipment:   Intra-op Plan:   Post-operative Plan:   Informed Consent: I have reviewed the patients History and Physical, chart, labs and discussed the procedure including the risks, benefits and alternatives for the proposed anesthesia with the patient or authorized representative who has indicated his/her understanding and acceptance.       Plan Discussed with:   Anesthesia Plan Comments:        Anesthesia Quick Evaluation  

## 2022-09-07 NOTE — Progress Notes (Signed)
Starting to have nausea and intermittent pressure. BP 119/80   Pulse 69   Temp 97.8 F (36.6 C) (Oral)   Resp 16   Ht 5\' 2"  (1.575 m)   Wt 106.4 kg   SpO2 97%   BMI 42.89 kg/m  CE ant lip, very soft, 0 to +1 Cat 1 tracing with early decels TOCO q53m with pitocin at 73mU/min  Anticipate SVD

## 2022-09-07 NOTE — Progress Notes (Signed)
DELIVERY NOTE  Pt complete and at +2 station with urge to push. Epidural controlling pain. Pt pushed and delivered a viable female infant in ROA position. Anterior and posterior shoulders spontaneously delivered with next two pushes; body easily followed next. Infant placed on mothers abdomen and bulb suction of mouth and nose performed. Cord was then clamped and cut by FOB. Cord blood obtained, 3VC. Baby had a vigorous spontaneous cry noted. Placenta then delivered at 1401 intact. Fundal massage performed and pitocin per protocol. Fundus firm. The following lacerations were noted: BL periurethral, 1st deg. Repaired in routine fashion with 4-0 monocryl and 3-0 vicryl, respectively Mother and baby stable. Counts correct   Infant time: 1357 Gender: female, DECLINES CIRC Placenta time: 1401 Apgars: 8/9 Weight: pending skin-to-skin

## 2022-09-07 NOTE — Anesthesia Procedure Notes (Signed)
Epidural Patient location during procedure: OB Start time: 09/07/2022 8:33 AM End time: 09/07/2022 8:43 AM  Staffing Anesthesiologist: Leonides Grills, MD Performed: anesthesiologist   Preanesthetic Checklist Completed: patient identified, IV checked, site marked, risks and benefits discussed, monitors and equipment checked, pre-op evaluation and timeout performed  Epidural Patient position: sitting Prep: DuraPrep Patient monitoring: heart rate, cardiac monitor, continuous pulse ox and blood pressure Approach: midline Location: L3-L4 Injection technique: LOR air  Needle:  Needle type: Tuohy  Needle gauge: 17 G Needle length: 9 cm Needle insertion depth: 6 cm Catheter type: closed end flexible Catheter size: 19 Gauge Catheter at skin depth: 12 cm Test dose: negative and 1.5% lidocaine with Epi 1:200 K  Assessment Events: blood not aspirated, no cerebrospinal fluid, injection not painful, no injection resistance and negative IV test  Additional Notes Informed consent obtained prior to proceeding including risk of failure, 1% risk of PDPH, risk of minor discomfort and bruising. Discussed alternatives to epidural analgesia and patient desires to proceed.  Timeout performed pre-procedure verifying patient name, procedure, and platelet count.  Patient tolerated procedure well. Reason for block:procedure for pain

## 2022-09-08 LAB — CBC
HCT: 30 % — ABNORMAL LOW (ref 36.0–46.0)
Hemoglobin: 10.1 g/dL — ABNORMAL LOW (ref 12.0–15.0)
MCH: 29.8 pg (ref 26.0–34.0)
MCHC: 33.7 g/dL (ref 30.0–36.0)
MCV: 88.5 fL (ref 80.0–100.0)
Platelets: 223 10*3/uL (ref 150–400)
RBC: 3.39 MIL/uL — ABNORMAL LOW (ref 3.87–5.11)
RDW: 13.4 % (ref 11.5–15.5)
WBC: 9.7 10*3/uL (ref 4.0–10.5)
nRBC: 0 % (ref 0.0–0.2)

## 2022-09-08 LAB — BIRTH TISSUE RECOVERY COLLECTION (PLACENTA DONATION)

## 2022-09-08 MED ORDER — DIPHENHYDRAMINE HCL 25 MG PO CAPS
25.0000 mg | ORAL_CAPSULE | Freq: Every evening | ORAL | Status: DC | PRN
Start: 2022-09-08 — End: 2022-09-08

## 2022-09-08 NOTE — Anesthesia Postprocedure Evaluation (Signed)
Anesthesia Post Note  Patient: Leslie Gill  Procedure(s) Performed: AN AD HOC LABOR EPIDURAL     Patient location during evaluation: Mother Baby Anesthesia Type: Epidural Level of consciousness: awake and alert Pain management: pain level controlled Vital Signs Assessment: post-procedure vital signs reviewed and stable Respiratory status: spontaneous breathing, nonlabored ventilation and respiratory function stable Cardiovascular status: stable Postop Assessment: no headache, no backache and epidural receding Anesthetic complications: no   No notable events documented.  Last Vitals:  Vitals:   09/08/22 0133 09/08/22 0505  BP: (!) 103/57 105/67  Pulse: 76 70  Resp: 16 16  Temp: 36.7 C 36.9 C  SpO2:      Last Pain:  Vitals:   09/08/22 0505  TempSrc: Oral  PainSc:    Pain Goal:                   [name]

## 2022-09-08 NOTE — Discharge Summary (Signed)
Postpartum Discharge Summary  Date of Service updated 09/08/22     Patient Name: Leslie Gill DOB: [birth year] MRN: [id number]  Date of admission: 09/07/2022 Delivery date:09/07/2022  Delivering provider: Carlisle Cater  Date of discharge: 09/08/2022  Admitting diagnosis: Encounter for planned induction of labor [Z34.90] Intrauterine pregnancy: [redacted]w[redacted]d     Secondary diagnosis:  Principal Problem:   Encounter for planned induction of labor  Additional problems: None    Discharge diagnosis: Term Pregnancy Delivered                                              Post partum procedures: n/a Augmentation: AROM and Pitocin Complications: None  Hospital course: Induction of Labor With Vaginal Delivery   27 y.o. yo G3P3003 at [redacted]w[redacted]d was admitted to the hospital 09/07/2022 for induction of labor.  Indication for induction: Favorable cervix at term.  Patient had an labor course complicated by n/a Membrane Rupture Time/Date: 9:48 AM ,09/07/2022   Delivery Method:Vaginal, Spontaneous  Episiotomy: None  Lacerations:  1st degree;Periurethral  Details of delivery can be found in separate delivery note.  Patient had a postpartum course complicated by n/a. Patient is discharged home 09/08/22.  Newborn Data: Birth date:09/07/2022  Birth time:1:57 PM  Gender:Female  Living status:Living  Apgars:9 ,9  Weight:4110 g     Physical exam  Vitals:   09/07/22 1726 09/07/22 2204 09/08/22 0133 09/08/22 0505  BP: 117/69 115/60 (!) 103/57 105/67  Pulse: 74 74 76 70  Resp: 16 17 16 16   Temp: 98.2 F (36.8 C) 98.4 F (36.9 C) 98 F (36.7 C) 98.4 F (36.9 C)  TempSrc: Oral Oral Oral Oral  SpO2: 98%     Weight:      Height:       General: alert, cooperative, and no distress Lochia: appropriate Uterine Fundus: firm Incision: N/A DVT Evaluation: No evidence of DVT seen on physical exam. Labs: Lab Results  Component Value Date   WBC 9.7 09/08/2022   HGB 10.1 (L) 09/08/2022   HCT 30.0 (L)  09/08/2022   MCV 88.5 09/08/2022   PLT 223 09/08/2022      Latest Ref Rng & Units 04/07/2010    5:30 AM  CMP  BUN 6 - 23 mg/dL 8   Creatinine 0.4 - 1.2 mg/dL 4.09    Edinburgh Score:    09/08/2022    5:05 AM  Edinburgh Postnatal Depression Scale Screening Tool  I have been able to laugh and see the funny side of things. 0  I have looked forward with enjoyment to things. 0  I have blamed myself unnecessarily when things went wrong. 1  I have been anxious or worried for no good reason. 0  I have felt scared or panicky for no good reason. 0  Things have been getting on top of me. 0  I have been so unhappy that I have had difficulty sleeping. 0  I have felt sad or miserable. 0  I have been so unhappy that I have been crying. 0  The thought of harming myself has occurred to me. 0  Edinburgh Postnatal Depression Scale Total 1      After visit meds:  Allergies as of 09/08/2022   No Known Allergies      Medication List     STOP taking these medications    levocetirizine 5  MG tablet Commonly known as: XYZAL   montelukast 10 MG tablet Commonly known as: Singulair   oxybutynin 10 MG 24 hr tablet Commonly known as: DITROPAN-XL   Ryaltris 665-25 MCG/ACT Susp Generic drug: Olopatadine-Mometasone   zonisamide 100 MG capsule Commonly known as: ZONEGRAN       TAKE these medications    acetaminophen 325 MG tablet Commonly known as: TYLENOL Take 325 mg by mouth every 6 (six) hours as needed for mild pain.   multivitamin-prenatal 27-0.8 MG Tabs tablet Take 1 tablet by mouth daily at 12 noon.         Discharge home in stable condition Infant Feeding: Breast Infant Disposition:home with mother Discharge instruction: per After Visit Summary and Postpartum booklet. Activity: Advance as tolerated. Pelvic rest for 6 weeks.  Diet: routine diet Anticipated Birth Control: Unsure Postpartum Appointment:6 weeks Additional Postpartum F/U:  n/a Future Appointments:No  future appointments. Follow up Visit:  Follow-up Information     Shivaji, Valerie Roys, MD Follow up in 6 week(s).   Specialty: Obstetrics and Gynecology Contact information: [address] [place] [address] [phone]                     09/08/2022 [facility] Lizabeth Leyden, MD

## 2022-09-08 NOTE — Progress Notes (Addendum)
Patient is doing well.  She is ambulating, voiding, tolerating PO.  Pain control is good.  Lochia is appropriate  Vitals:   09/07/22 1726 09/07/22 2204 09/08/22 0133 09/08/22 0505  BP: 117/69 115/60 (!) 103/57 105/67  Pulse: 74 74 76 70  Resp: 16 17 16 16   Temp: 98.2 F (36.8 C) 98.4 F (36.9 C) 98 F (36.7 C) 98.4 F (36.9 C)  TempSrc: Oral Oral Oral Oral  SpO2: 98%     Weight:      Height:        NAD Fundus firm Ext: trace edema bilaterally  Lab Results  Component Value Date   WBC 9.7 09/08/2022   HGB 10.1 (L) 09/08/2022   HCT 30.0 (L) 09/08/2022   MCV 88.5 09/08/2022   PLT 223 09/08/2022    --/--/A POS (05/28 0221)  A/P 27 y.o. [id number] PPD#1 s/p TSVD Meeting all goals.  Discharge to home today.   [facility] GEFFEL [employer]

## 2022-09-16 ENCOUNTER — Telehealth (HOSPITAL_COMMUNITY): Payer: Self-pay | Admitting: *Deleted

## 2022-09-16 NOTE — Telephone Encounter (Signed)
Attempted Hospital Discharge Follow-Up Call.  Left voice mail requesting that patient return RN's phone call if patient has any concerns or questions.  

## 2022-09-20 ENCOUNTER — Inpatient Hospital Stay (HOSPITAL_COMMUNITY): Payer: BC Managed Care – PPO

## 2024-05-30 ENCOUNTER — Encounter
